# Patient Record
Sex: Male | Born: 1980 | Hispanic: Yes | Marital: Single | Smoking: Never smoker
Health system: Southern US, Community
[De-identification: ages and names within clinical notes are randomized; demographics above are authoritative.]

## PROBLEM LIST (undated history)

## (undated) HISTORY — PX: HERNIA REPAIR: SHX51

---

## 2013-09-13 ENCOUNTER — Emergency Department (HOSPITAL_COMMUNITY): Payer: Worker's Compensation

## 2013-09-13 ENCOUNTER — Emergency Department (HOSPITAL_COMMUNITY)
Admission: EM | Admit: 2013-09-13 | Discharge: 2013-09-13 | Disposition: A | Discharge status: Home / Self Care | Payer: Worker's Compensation | Attending: Emergency Medicine | Admitting: Emergency Medicine

## 2013-09-13 ENCOUNTER — Encounter (HOSPITAL_COMMUNITY): Payer: Self-pay | Admitting: Emergency Medicine

## 2013-09-13 DIAGNOSIS — Y929 Unspecified place or not applicable: Secondary | ICD-10-CM | POA: Insufficient documentation

## 2013-09-13 DIAGNOSIS — W11XXXA Fall on and from ladder, initial encounter: Secondary | ICD-10-CM | POA: Insufficient documentation

## 2013-09-13 DIAGNOSIS — S46909A Unspecified injury of unspecified muscle, fascia and tendon at shoulder and upper arm level, unspecified arm, initial encounter: Secondary | ICD-10-CM | POA: Diagnosis present

## 2013-09-13 DIAGNOSIS — S4980XA Other specified injuries of shoulder and upper arm, unspecified arm, initial encounter: Secondary | ICD-10-CM | POA: Diagnosis present

## 2013-09-13 DIAGNOSIS — Y939 Activity, unspecified: Secondary | ICD-10-CM | POA: Insufficient documentation

## 2013-09-13 DIAGNOSIS — S4992XA Unspecified injury of left shoulder and upper arm, initial encounter: Secondary | ICD-10-CM

## 2013-09-13 MED ORDER — HYDROCODONE-ACETAMINOPHEN 5-325 MG PO TABS: 1.0000 | ORAL_TABLET | ORAL | Status: DC | PRN

## 2013-09-13 MED ORDER — HYDROCODONE-ACETAMINOPHEN 5-325 MG PO TABS
2.0000 | ORAL_TABLET | Freq: Once | ORAL | Status: AC
  Administered 2013-09-13: 2 via ORAL
  Filled 2013-09-13: qty 2

## 2013-09-13 NOTE — ED Notes (Signed)
Patient was hurt at work a few weeks ago when a ladder fell on his left shoulder.  Patient continues to have pain.  Work sent him in to be evaluated.  Patient is CAOx3.  CSMT's and pulses intact.

## 2013-09-13 NOTE — Discharge Instructions (Signed)
Take Vicodin as needed for pain. Follow up with Dr. Roda ShuttersXu for further evaluation. Wear arm sling until further evaluation.

## 2013-09-13 NOTE — ED Provider Notes (Signed)
CSN: [id number]     Arrival date & time 09/13/13  1914 History  This chart was scribed for non-physician practitioner, Emilia Beck, PA-C working with Gavin Pound. Oletta Lamas, MD by Greggory Stallion, ED scribe. This patient was seen in room TR05C/TR05C and the patient's care was started at 8:51 PM.   Chief Complaint  Patient presents with  . Shoulder Pain   The history is provided by the patient. A language interpreter was used (pt's girlfriend).   HPI Comments: Rylon Poitra is a 33 y.o. male who presents to the Emergency Department complaining of left shoulder injury that occurred a few weeks ago. Pt states he fell off of a ladder and landed on his right shoulder. He has sudden onset, constant right shoulder pain. Denies other injury or pain.   History reviewed. No pertinent past medical history. Past Surgical History  Procedure Laterality Date  . Hernia repair     History reviewed. No pertinent family history. History  Substance Use Topics  . Smoking status: Never Smoker   . Smokeless tobacco: Not on file  . Alcohol Use: Yes     Comment: seldom    Review of Systems  Musculoskeletal: Positive for arthralgias.  All other systems reviewed and are negative.   Allergies  Review of patient's allergies indicates no known allergies.  Home Medications   Current Outpatient Rx  Name  Route  Sig  Dispense  Refill  . acetaminophen (TYLENOL) 500 MG tablet   Oral   Take 1,000 mg by mouth every 6 (six) hours as needed for mild pain or moderate pain.         Marland Kitchen ibuprofen (ADVIL,MOTRIN) 400 MG tablet   Oral   Take 400 mg by mouth every 6 (six) hours as needed for headache or mild pain.          BP 136/89  Pulse 86  Temp(Src) 98 F (36.7 C) (Oral)  Resp 18  SpO2 100%  Physical Exam  Nursing note and vitals reviewed. Constitutional: He is oriented to person, place, and time. He appears well-developed and well-nourished. No distress.  HENT:  Head: Normocephalic and atraumatic.   Eyes: EOM are normal.  Neck: Neck supple. No tracheal deviation present.  Cardiovascular: Normal rate.   Pulmonary/Chest: Effort normal. No respiratory distress.  Musculoskeletal: Normal range of motion.  Left AC joint tenderness to palpation. Left shoulder limited ROM due to pain. No obvious deformity.   Neurological: He is alert and oriented to person, place, and time.  Skin: Skin is warm and dry.  Psychiatric: He has a normal mood and affect. His behavior is normal.    ED Course  Procedures (including critical care time)  DIAGNOSTIC STUDIES: Oxygen Saturation is 100% on RA, normal by my interpretation.    COORDINATION OF CARE: 8:53 PM-Discussed treatment plan which includes sling, pain medication and orthopedic referral with pt at bedside and pt agreed to plan.   Labs Review Labs Reviewed - No data to display Imaging Review Dg Shoulder Left  09/13/2013   CLINICAL DATA:  Left shoulder injury and pain.  EXAM: LEFT SHOULDER - 2+ VIEW  COMPARISON:  None.  FINDINGS: No acute fracture or dislocation is identified. There may be minimal elevation of the distal clavicle relative to the acromion, consistent with AC joint sprain. No soft tissue abnormalities are identified.  IMPRESSION: No acute fracture.  Suggestion of AC joint sprain.   Electronically Signed   By: Irish Lack M.D.   On: 09/13/2013 20:15  EKG Interpretation   None       MDM   1. Injury of left shoulder     8:59 PM Patient will have sling for left arm. Patient advised to follow up with Orthopedics for further evaluation. No other injury. Patient will have Vicodin as needed for pain. Vitals stable and patient afebrile.   I personally performed the services described in this documentation, which was scribed in my presence. The recorded information has been reviewed and is accurate.   Emilia BeckKaitlyn Joanmarie Tsang, PA-C 09/13/13 2100

## 2013-09-14 NOTE — ED Provider Notes (Signed)
Medical screening examination/treatment/procedure(s) were performed by non-physician practitioner and as supervising physician I was immediately available for consultation/collaboration.  EKG Interpretation   None         Antanasia Kaczynski Y. Lorraine Cimmino, MD 09/14/13 2056 

## 2013-09-21 ENCOUNTER — Emergency Department (HOSPITAL_COMMUNITY)
Admission: EM | Admit: 2013-09-21 | Discharge: 2013-09-21 | Disposition: A | Discharge status: Home / Self Care | Payer: Self-pay | Attending: Emergency Medicine | Admitting: Emergency Medicine

## 2013-09-21 ENCOUNTER — Encounter (HOSPITAL_COMMUNITY): Payer: Self-pay | Admitting: Emergency Medicine

## 2013-09-21 DIAGNOSIS — Z9889 Other specified postprocedural states: Secondary | ICD-10-CM | POA: Insufficient documentation

## 2013-09-21 DIAGNOSIS — K4090 Unilateral inguinal hernia, without obstruction or gangrene, not specified as recurrent: Secondary | ICD-10-CM | POA: Insufficient documentation

## 2013-09-21 NOTE — ED Notes (Signed)
Swelling present on the left groin area. Pt is tender to touch. Pain is currently 3/10 while laying in the bed but 9/10 with movement.

## 2013-09-21 NOTE — Discharge Instructions (Signed)
Hernia inguinal - Adultos  (Inguinal Hernia, Adult)  Los msculos mantienen todos los rganos del cuerpo en Financial controllerel lugar correcto. Pero si se produce un punto dbil Valero Energyentre los msculos, algunos pueden protruir. Eso se llama hernia. Cuando esto sucede en la parte inferior del vientre (abdomen), se trata de una hernia inguinal. (Toma su nombre de una parte del cuerpo que en esta regin se llamada canal inguinal). Un punto dbil en la pared de los msculos deja que un poco de grasa o parte del intestino delgado salgan hacia afuera. Una hernia inguinal puede desarrollarse a cualquier edad. Los hombres la sufren con ms frecuencia que las mujeres.  CAUSAS  En los adultos, la hernia inguinal desarrolla con el tiempo.   Las causas pueden ser:  Un esfuerzo sbito de los msculos de la parte inferior del abdomen.  Levantar objetos pesados.  Dificultad para mover el intestino. La dificultad para mover el intestino (constipacin) puede llevar a una hernia.  Tos constante. La causa puede ser el tabaquismo o una enfermedad pulmonar.  Tener sobrepeso.  El [place]barazo.  Tener un empleo que requiera Location managerpermanecer largos perodos de pie o levantar objetos pesados.  Haber sufrido de una hernia inguinal anteriormente. En algunos casos puede convertirse en una situacin de Associate Professoremergencia. Cuando esto ocurre, se llama hernia inguinal estrangulada. Se produce cuando una parte del intestino delgado se desliza a travs del punto dbil y no puede volver al abdomen. El flujo de [place] puede interrumpirse. Si esto ocurre, una parte del intestino puede morir. Esta situacin requiere [place] ciruga de [place]urgencia.  SNTOMAS  Generalmente una hernia inguinal pequea no tiene sntomas. Se diagnostica cuando un profesional de la salud hace un examen fsico. Las hernias ms grandes generalmente presentan sntomas.   En los adultos, los sntomas incluyen:  Un bulto en la ingle. Es fcil de Engineer, manufacturingdetectar cuando la persona est de pie. Puede  desaparecer al Javier Glazierestar acostado.  Los hombres pueden tener un bulto Proofreaderen el escroto.  Dolor o ardor en la ingle. Esto ocurre especialmente al levantar objetos, realizar un esfuerzo o toser.  Dolor sordo o sensacin de presin en la ingle.  Los signos de una hernia estrangulada pueden ser:  Neomia DearUna protuberancia en la ingle que duele mucho y est sensible al tacto.  Un bulto que se vuelve de color rojo o prpura.  Grant RutsFiebre, nuseas y vmitos.  Imposibilidad de evacuar el intestino o de eliminar gases. DIAGNSTICO  Para diagnosticar una hernia inguinal, el profesional le har un examen fsico.   Incluir preguntas acerca de los sntomas que haya notado.  El mdico palpar el rea de la ingle y le pedir que tosa. Si palpa una hernia inguinal, el mdico podr tratar de deslizarla de nuevo hacia adentro el abdomen.  Por lo general no se necesitan otros estudios. TRATAMIENTO  Los tratamientos IT consultantpueden variar. Dependern del tamao de la hernia. Las opciones incluyen:   Observacin cuidadosa. Esto a menudo se sugiere si la hernia es pequea y usted no ha tenido sntomas.  No se realizar ningn procedimiento mdico excepto que aparezcan sntomas.  Tendr que prestar atencin a los sntomas. Si tiene sntomas, comunquese con su mdico de inmediato.  Ciruga. Se realiza si la hernia es grande o si tiene sntomas.  Ciruga abierta. Por lo general, este es un procedimiento ambulatorio (no tendr que pasar la noche en el hospital). Se realiza un corte (incisin) a travs de la piel de la ingle. La hernia se vuelve a colocar en el interior del abdomen.  Luego se repara la zona dbil en los msculos con una herniorrafia o hernioplastia. Herniorrafa: en este tipo de [place], se suturan juntos los msculos dbiles. Hernioplasta: se coloca un parche o malla para cerrar el rea dbil en la pared abdominal.  Laparoscopia. En este procedimiento, el cirujano hace incisiones pequeas. Se coloca en el abdomen  un tubo delgado con una pequea cmara de video (llamado laparoscopio). El cirujano repara la hernia con [place], observando en una cmara de vdeo y [address] instrumentos largos. INSTRUCCIONES PARA EL CUIDADO EN EL HOGAR   Despus de la ciruga de reparacin de una hernia inguinal:  Necesitar tomar un analgsico para el dolor recetado por su mdico. Siga cuidadosamente todas las indicaciones.  Tendr que cuidar la herida de la incisin.  Deber restringir algunas actividades por un tiempo. Incluir no levantar objetos pesados   durante varias semanas. Tampoco podr hacer nada demasiado activo durante algunas semanas. La vuelta al Aleen Campi depender del tipo de trabajo que tenga.  Durante perodos de "espera vigilante", usted debe:  Mantenga un peso saludable.  Consumir una dieta rica en fibra (frutas, verduras y granos enteros).  Beba gran cantidad de lquidos para evitar la constipacin. Esto significa beber suficiente agua y otros lquidos para mantener la orina clara o de color amarillo plido.  No levante objetos pesados.  No permanezca de pie durante largos perodos.  Deje de fumar. Evite toser con frecuencia. SOLICITE ATENCIN MDICA SI:   Aparece una protuberancia en el rea de la ingle.  Siente dolor, tiene sensacin de [place] o de presin en la ingle. Esto podra empeorar si levanta pesos o hace esfuerzos.  Tiene fiebre de ms de 100.5 F (38.1 C). SOLICITE ATENCIN MDICA DE INMEDIATO SI:   El dolor en la ingle aumenta repentinamente.  Una protuberancia en la ingle se hace ms grande y no baja.  En los hombres, un dolor repentino en el escroto. O el escroto aumenta de tamao.  Un bulto en el rea de la ingle se vuelve de color rojo o prpura y es dolorosa al tacto.  Tiene nuseas o vmitos que no desaparecen.  Siente que su corazn late mucho ms rpido de lo normal.  No puede mover el intestino o eliminar gases.  Tiene fiebre de ms de 102.0 F  (38.9 C). Document Released: 11/30/2012 [facility] Patient Information 2014 [place], [place].

## 2013-09-21 NOTE — ED Notes (Addendum)
Pt reports left inguinal hernia x 1 year. States pain has been worse over past few days but it can be reduced. Pt speaks spanish, has significant other translating. Denies any issues with bowel movement or urination. Pt is ambulatory, a x 4. In NAD

## 2013-09-21 NOTE — ED Provider Notes (Signed)
CSN: [id number]     Arrival date & time 09/21/13  1236 History   First MD Initiated Contact with Patient 09/21/13 1248     Chief Complaint  Patient presents with  . Inguinal Hernia   HPI Patient presents with your long history of left inguinal hernia which is easily reducible.  The hernia returns with any exertion or walking.  It is not associated with nausea, vomiting, or bowel distention.  Patient has had no fever or chills.  Has previous history of hernia on the right side which was repaired. History reviewed. No pertinent past medical history. Past Surgical History  Procedure Laterality Date  . Hernia repair     No family history on file. History  Substance Use Topics  . Smoking status: Never Smoker   . Smokeless tobacco: Not on file  . Alcohol Use: Yes     Comment: seldom    Review of Systems  Unable to perform ROS All other systems reviewed and are negative.    Allergies  Review of patient's allergies indicates no known allergies.  Home Medications   Current Outpatient Rx  Name  Route  Sig  Dispense  Refill  . acetaminophen (TYLENOL) 500 MG tablet   Oral   Take 1,000 mg by mouth every 6 (six) hours as needed for mild pain or moderate pain.         Marland Kitchen. HYDROcodone-acetaminophen (NORCO/VICODIN) 5-325 MG per tablet   Oral   Take 1-2 tablets by mouth every 4 (four) hours as needed.   14 tablet   0   . ibuprofen (ADVIL,MOTRIN) 400 MG tablet   Oral   Take 400 mg by mouth every 6 (six) hours as needed for headache or mild pain.          BP 139/93  Pulse 89  Temp(Src) 97.7 F (36.5 C) (Oral)  Resp 20  Ht 5\' 10"  (1.778 m)  Wt 180 lb (81.647 kg)  BMI 25.83 kg/m2  SpO2 98% Physical Exam  Nursing note and vitals reviewed. Constitutional: He is oriented to person, place, and time. He appears well-developed and well-nourished. No distress.  HENT:  Head: Normocephalic and atraumatic.  Eyes: Pupils are equal, round, and reactive to light.  Neck: Normal range of  motion.  Cardiovascular: Normal rate and intact distal pulses.   Pulmonary/Chest: No respiratory distress.  Abdominal: Soft. Normal appearance and bowel sounds are normal. He exhibits no distension. A hernia is present. Hernia confirmed positive in the left inguinal area (Easily reducible).  Musculoskeletal: Normal range of motion.  Neurological: He is alert and oriented to person, place, and time. No cranial nerve deficit.  Skin: Skin is warm and dry. No rash noted.  Psychiatric: He has a normal mood and affect. His behavior is normal.    ED Course  Procedures (including critical care time) Labs Review Labs Reviewed - No data to display Imaging Review No results found.  I discussed warning signs were incarcerated hernia with patient through the interpreter.  Patient was instructed to call for appointment with general surgery as soon as possible.  Patient instructed to return to emergency department if any signs of incarceration such as unremitting pain, abdominal distention, vomiting, nausea, fever.  MDM   1. Reducible inguinal hernia        Nelia Shiobert L Donnajean Chesnut, MD 09/21/13 [phone]

## 2019-12-11 ENCOUNTER — Emergency Department (HOSPITAL_COMMUNITY)
Admission: EM | Admit: 2019-12-11 | Discharge: 2019-12-11 | Disposition: A | Discharge status: Home / Self Care | Payer: Self-pay | Attending: Emergency Medicine | Admitting: Emergency Medicine

## 2019-12-11 ENCOUNTER — Other Ambulatory Visit: Payer: Self-pay

## 2019-12-11 DIAGNOSIS — Y929 Unspecified place or not applicable: Secondary | ICD-10-CM | POA: Insufficient documentation

## 2019-12-11 DIAGNOSIS — Y939 Activity, unspecified: Secondary | ICD-10-CM | POA: Insufficient documentation

## 2019-12-11 DIAGNOSIS — Z23 Encounter for immunization: Secondary | ICD-10-CM | POA: Insufficient documentation

## 2019-12-11 DIAGNOSIS — S0101XA Laceration without foreign body of scalp, initial encounter: Secondary | ICD-10-CM | POA: Insufficient documentation

## 2019-12-11 DIAGNOSIS — Y999 Unspecified external cause status: Secondary | ICD-10-CM | POA: Insufficient documentation

## 2019-12-11 MED ORDER — CEPHALEXIN 500 MG PO CAPS: 500.0000 mg | ORAL_CAPSULE | Freq: Two times a day (BID) | ORAL | 0 refills | Status: AC

## 2019-12-11 MED ORDER — TETANUS-DIPHTH-ACELL PERTUSSIS 5-2.5-18.5 LF-MCG/0.5 IM SUSP
0.5000 mL | Freq: Once | INTRAMUSCULAR | Status: AC
  Administered 2019-12-11: 09:00:00 0.5 mL via INTRAMUSCULAR
  Filled 2019-12-11: qty 0.5

## 2019-12-11 MED ORDER — CEPHALEXIN 250 MG PO CAPS
500.0000 mg | ORAL_CAPSULE | Freq: Once | ORAL | Status: AC
  Administered 2019-12-11: 09:00:00 500 mg via ORAL
  Filled 2019-12-11: qty 2

## 2019-12-11 MED ORDER — OXYCODONE-ACETAMINOPHEN 5-325 MG PO TABS
1.0000 | ORAL_TABLET | Freq: Once | ORAL | Status: AC
  Administered 2019-12-11: 09:00:00 1 via ORAL
  Filled 2019-12-11: qty 1

## 2019-12-11 NOTE — Discharge Instructions (Addendum)
Staple repair Keep the laceration site dry for the next 24 hours and leave the dressing in place. After 24 hours you may remove the dressing and gently clean the laceration site with antibacterial soap and warm water. Do not scrub the area. Do not soak the area and water for long periods of time. Don't use hydrogen peroxide, iodine-based solutions, or alcohol, which can slow healing, and will probably be painful! Apply topical bacitracin 1-2 times per day for the next 3-5 days. Return to the emergency department in 7-10 days for removal of the staples.  You should return sooner for any signs of infection which would include increased redness around the wound, increased swelling, new drainage of yellow pus.   Take antibiotics as prescribed. Please use Tylenol or ibuprofen for pain.  You may use 600 mg ibuprofen every 6 hours or 1000 mg of Tylenol every 6 hours.  You may choose to alternate between the 2.  This would be most effective.  Not to exceed 4 g of Tylenol within 24 hours.  Not to exceed 3200 mg ibuprofen 24 hours.
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
## 2020-05-27 ENCOUNTER — Emergency Department (HOSPITAL_COMMUNITY): Payer: Self-pay

## 2020-05-27 ENCOUNTER — Emergency Department (HOSPITAL_COMMUNITY)
Admission: EM | Admit: 2020-05-27 | Discharge: 2020-05-27 | Disposition: A | Discharge status: Home / Self Care | Payer: Self-pay | Attending: Emergency Medicine | Admitting: Emergency Medicine

## 2020-05-27 DIAGNOSIS — K4090 Unilateral inguinal hernia, without obstruction or gangrene, not specified as recurrent: Secondary | ICD-10-CM | POA: Insufficient documentation

## 2020-05-27 LAB — COMPREHENSIVE METABOLIC PANEL
ALT: 27 U/L (ref 0–44)
AST: 20 U/L (ref 15–41)
Albumin: 3.8 g/dL (ref 3.5–5.0)
Alkaline Phosphatase: 77 U/L (ref 38–126)
Anion gap: 10 (ref 5–15)
BUN: 13 mg/dL (ref 6–20)
CO2: 24 mmol/L (ref 22–32)
Calcium: 9.3 mg/dL (ref 8.9–10.3)
Chloride: 105 mmol/L (ref 98–111)
Creatinine, Ser: 0.89 mg/dL (ref 0.61–1.24)
GFR, Estimated: >60 mL/min (ref >60)
Glucose, Bld: 96 mg/dL (ref 70–99)
Potassium: 3.4 mmol/L — ABNORMAL LOW (ref 3.5–5.1)
Sodium: 139 mmol/L (ref 135–145)
Total Bilirubin: 0.5 mg/dL (ref 0.3–1.2)
Total Protein: 7 g/dL (ref 6.5–8.1)

## 2020-05-27 LAB — CBC
HCT: 45.6 % (ref 39.0–52.0)
Hemoglobin: 15 g/dL (ref 13.0–17.0)
MCH: 32.2 pg (ref 26.0–34.0)
MCHC: 32.9 g/dL (ref 30.0–36.0)
MCV: 97.9 fL (ref 80.0–100.0)
Platelets: 252 10*3/uL (ref 150–400)
RBC: 4.66 MIL/uL (ref 4.22–5.81)
RDW: 12.1 % (ref 11.5–15.5)
WBC: 7 10*3/uL (ref 4.0–10.5)
nRBC: 0 % (ref 0.0–0.2)

## 2020-05-27 LAB — URINALYSIS, ROUTINE W REFLEX MICROSCOPIC
Bilirubin Urine: NEGATIVE
Glucose, UA: NEGATIVE mg/dL
Hgb urine dipstick: NEGATIVE
Ketones, ur: NEGATIVE mg/dL
Leukocytes,Ua: NEGATIVE
Nitrite: NEGATIVE
Protein, ur: NEGATIVE mg/dL
Specific Gravity, Urine: 1.005 (ref 1.005–1.030)
pH: 6 (ref 5.0–8.0)

## 2020-05-27 LAB — LIPASE, BLOOD: Lipase: 31 U/L (ref 11–51)

## 2020-05-27 MED ORDER — FENTANYL CITRATE (PF) 100 MCG/2ML IJ SOLN
50.0000 ug | Freq: Once | INTRAMUSCULAR | Status: AC
  Administered 2020-05-27: 50 ug via INTRAVENOUS
  Filled 2020-05-27: qty 2

## 2020-05-27 MED ORDER — HYDROCODONE-ACETAMINOPHEN 5-325 MG PO TABS: 1.0000 | ORAL_TABLET | Freq: Four times a day (QID) | ORAL | 0 refills | Status: DC | PRN

## 2020-05-27 MED ORDER — IOHEXOL 300 MG/ML  SOLN
100.0000 mL | Freq: Once | INTRAMUSCULAR | Status: AC | PRN
  Administered 2020-05-27: 100 mL via INTRAVENOUS

## 2020-05-27 MED ORDER — ONDANSETRON HCL 4 MG/2ML IJ SOLN
4.0000 mg | Freq: Once | INTRAMUSCULAR | Status: AC
  Administered 2020-05-27: 4 mg via INTRAVENOUS
  Filled 2020-05-27: qty 2

## 2020-05-27 NOTE — ED Provider Notes (Signed)
[facility] EMERGENCY DEPARTMENT Provider Note   CSN: [id number] Arrival date & time: 05/27/20  1231     History Chief Complaint  Patient presents with  . Abdominal Pain    Ryan Holt is a 39 y.o. male who presents for evaluation of left lower quadrant abdominal pain and left testicular pain x2 weeks.  He states that he has had some intermittent abdominal pain over the last 2 weeks it feels like over the last few days, it is gotten worse.  He states the pain starts in his left lower quadrant and radiates into his groin.  He has a history of a hernia and states that it has been out for several months but it started growing larger.  He has not seen anybody for evaluation of this.  He has not noted any fevers.  He does state that he has been able to urinate but that sometimes he has to strain to urinate.  He has not been taking any medications.  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  He has not had any nausea/vomiting, diarrhea, difficulty urinating.  The history is provided by the patient. A language interpreter was used.       No past medical history on file.  There are no problems to display for this patient.   Past Surgical History:  Procedure Laterality Date  . HERNIA REPAIR         No family history on file.  Social History   Tobacco Use  . Smoking status: Never Smoker  Substance Use Topics  . Alcohol use: Yes    Comment: seldom  . Drug use: No    Home Medications Prior to Admission medications   Medication Sig Start Date End Date Taking? Authorizing Provider  acetaminophen (TYLENOL) 500 MG tablet Take 1,000 mg by mouth every 6 (six) hours as needed for mild pain or moderate pain.    [provider]  HYDROcodone-acetaminophen (NORCO/VICODIN) 5-325 MG tablet Take 1-2 tablets by mouth every 6 (six) hours as needed. 05/27/20   Maxwell Caul, PA-C  ibuprofen  (ADVIL,MOTRIN) 400 MG tablet Take 400 mg by mouth every 6 (six) hours as needed for headache or mild pain.    [provider]    Allergies    Patient has no known allergies.  Review of Systems   Review of Systems  Constitutional: Negative for fever.  Respiratory: Negative for cough and shortness of breath.   Cardiovascular: Negative for chest pain.  Gastrointestinal: Positive for abdominal pain. Negative for nausea and vomiting.  Genitourinary: Positive for scrotal swelling and testicular pain. Negative for difficulty urinating, dysuria and hematuria.  Neurological: Negative for headaches.  All other systems reviewed and are negative.   Physical Exam Updated Vital Signs BP 119/69   Pulse 71   Temp 98.5 F (36.9 C) (Oral)   Resp 16   Ht 5\' 6"  (1.676 m)   SpO2 98%   BMI 29.05 kg/m   Physical Exam Vitals and nursing note reviewed. Exam conducted with a chaperone present.  Constitutional:      Appearance: Normal appearance. He is well-developed.  HENT:     Head: Normocephalic and atraumatic.  Eyes:     General: Lids are normal.     Conjunctiva/sclera: Conjunctivae normal.     Pupils: Pupils are equal, round, and reactive to light.  Cardiovascular:     Rate and Rhythm:  Normal rate and regular rhythm.     Pulses: Normal pulses.     Heart sounds: Normal heart sounds. No murmur heard.  No friction rub. No gallop.   Pulmonary:     Effort: Pulmonary effort is normal.     Breath sounds: Normal breath sounds.     Comments: Lungs clear to auscultation bilaterally.  Symmetric chest rise.  No wheezing, rales, rhonchi. Abdominal:     Palpations: Abdomen is soft. Abdomen is not rigid.     Tenderness: There is no abdominal tenderness. There is left CVA tenderness. There is no guarding.     Hernia: A hernia is present.     Comments: Abdomen soft, nondistended.  Tenderness palpation in the left lower quadrant.  Genitourinary:    Comments: The exam was performed with a  chaperone present Loree Fee).  Large inguinal hernia noted that extends into the left testicle.  No overlying warmth, erythema.  No tenderness palpation of the right testicle. Musculoskeletal:        General: Normal range of motion.     Cervical back: Full passive range of motion without pain.  Skin:    General: Skin is warm and dry.     Capillary Refill: Capillary refill takes less than 2 seconds.  Neurological:     Mental Status: He is alert and oriented to person, place, and time.  Psychiatric:        Speech: Speech normal.     ED Results / Procedures / Treatments   Labs (all labs ordered are listed, but only abnormal results are displayed) Labs Reviewed  COMPREHENSIVE METABOLIC PANEL - Abnormal; Notable for the following components:      Result Value   Potassium 3.4 (*)    All other components within normal limits  URINALYSIS, ROUTINE W REFLEX MICROSCOPIC - Abnormal; Notable for the following components:   Color, Urine STRAW (*)    All other components within normal limits  LIPASE, BLOOD  CBC    EKG None  Radiology CT ABDOMEN PELVIS W CONTRAST  Result Date: 05/27/2020 CLINICAL DATA:  Abdominal pain radiating to left groin. Suspected hernia. EXAM: CT ABDOMEN AND PELVIS WITH CONTRAST TECHNIQUE: Multidetector CT imaging of the abdomen and pelvis was performed using the standard protocol following bolus administration of intravenous contrast. CONTRAST:  OMNIPAQUE IOHEXOL 300 MG/ML  SOLN COMPARISON:  None. FINDINGS: Lower Chest: No acute findings. Hepatobiliary: No hepatic masses identified. Gallbladder is unremarkable. No evidence of biliary ductal dilatation. Pancreas:  No mass or inflammatory changes. Spleen: Within normal limits in size and appearance. Adrenals/Urinary Tract: No masses identified. No evidence of ureteral calculi or hydronephrosis. Stomach/Bowel: No evidence of obstruction, inflammatory process or abnormal fluid collections. Normal appendix visualized.  Vascular/Lymphatic: No pathologically enlarged lymph nodes. No abdominal aortic aneurysm. Reproductive:  No mass or other significant abnormality. Other: A large left inguinal hernia is seen which contains only fat. Musculoskeletal:  No suspicious bone lesions identified. IMPRESSION: Large left inguinal hernia containing only fat. Electronically Signed   By: Danae Orleans M.D.   On: 05/27/2020 18:55    Procedures Procedures (including critical care time)  Medications Ordered in ED Medications  fentaNYL (SUBLIMAZE) injection 50 mcg (50 mcg Intravenous Given 05/27/20 1744)  ondansetron (ZOFRAN) injection 4 mg (4 mg Intravenous Given 05/27/20 1744)  iohexol (OMNIPAQUE) 300 MG/ML solution 100 mL (100 mLs Intravenous Contrast Given 05/27/20 1838)  fentaNYL (SUBLIMAZE) injection 50 mcg (50 mcg Intravenous Given 05/27/20 1850)    ED Course  I  have reviewed the triage vital signs and the nursing notes.  Pertinent labs & imaging results that were available during my care of the patient were reviewed by me and considered in my medical decision making (see chart for details).    MDM Rules/Calculators/A&P                          39 year old male who presents for evaluation of abdominal pain has been ongoing for the last several weeks.  Worsened over the last few days.  History of hernia has never had it repaired.  Initially arrival, he is afebrile nontoxic-appearing.  Vital signs are stable.  On exam, he has tenderness palpation of the left lower quadrant as well as left testicle with obvious hernia noted.  Will obtain blood work, give analgesics, obtain a CT scan.  CBC shows no leukocytosis or anemia.  UA shows no evidence of infection.  Lipase is unremarkable.  CMP shows potassium of 3.4.  CT scan shows large left inguinal hernia containing only fat.  No other acute abnormalities.  I discussed with patient via language interpreter.  At this time, his work-up is not concerning for infectious or  ischemic etiology.  His CT scan shows that the hernia only contains fat.  No signs of obstruction.  He has been able to urinate here in the ED.  We will plan for pain control, GEN surge follow-up. At this time, patient exhibits no emergent life-threatening condition that require further evaluation in ED. Discussed patient with Dr. Silverio Lay is who is agreeable. Patient had ample opportunity for questions and discussion. All patient's questions were answered with full understanding. Strict return precautions discussed. Patient expresses understanding and agreement to plan.   Portions of this note were generated with Scientist, clinical (histocompatibility and immunogenetics). Dictation errors may occur despite best attempts at proofreading.   Final Clinical Impression(s) / ED Diagnoses Final diagnoses:  Left inguinal hernia    Rx / DC Orders ED Discharge Orders         Ordered    HYDROcodone-acetaminophen (NORCO/VICODIN) 5-325 MG tablet  Every 6 hours PRN        05/27/20 1957           Maxwell Caul, PA-C 05/27/20 2011    Charlynne Pander, MD 05/27/20 2033

## 2020-05-27 NOTE — Discharge Instructions (Addendum)
You can take Tylenol or Ibuprofen as directed for pain. You can alternate Tylenol and Ibuprofen every 4 hours. If you take Tylenol at 1pm, then you can take Ibuprofen at 5pm. Then you can take Tylenol again at 9pm.   Take pain medications as directed for break through pain. Do not drive or operate machinery while taking this medication.   As we discussed, you can [facility] belt at [facility] to help support.  Your work-up today was reassuring.  Your CT scan showed that the hernia only contains fat with no signs of it containing your small intestine.  This is reassuring.  Because this contains fat, we will leave it out.  You need to follow-up with the referred general surgeon for further evaluation and management of this.  As we discussed, closely monitor your symptoms.  Return to the emergency department for any worsening pain, fevers, inability to urinate, vomiting or any other worsening concerning symptoms.

## 2020-05-27 NOTE — ED Triage Notes (Signed)
Pt presents for eval of abdominal pain with radiation to groin, worse with movment. Pt concerned he has a hernia. Pain present for "a while" but pain worse within the last week. Denies n/v/d.

## 2020-05-31 ENCOUNTER — Ambulatory Visit: Payer: Self-pay | Admitting: General Surgery

## 2020-05-31 NOTE — H&P (Signed)
History of Present Illness Ryan Filler MD; 05/31/2020 3:10 PM) The patient is a 39 year old male who presents with an inguinal hernia. Patient is a 39 year old male, Spanish-speaking comes in with a large left inguinal hernia. Patient states is been there for approximately 2 years. He states become more bothersome over the last couple months. 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  Patient states is able to massage the hernia and reduce it is lying down. He states it thereafter usually pops back out.  He's had only a previous open right inguinal hernia repair in the past as a child.   Diagnostic Studies History Ryan Holt, CMA; 05/31/2020 2:47 PM) Colonoscopy  never  Allergies (Ryan Holt, CMA; 05/31/2020 2:47 PM) No Known Drug Allergies  [05/31/2020]: Allergies Reconciled   Medication History (Ryan Holt, CMA; 05/31/2020 2:48 PM) HYDROcodone-Acetaminophen (5-325MG  Tablet, Oral) Active. Medications Reconciled  Social History Ryan Holt, CMA; 05/31/2020 2:47 PM) Caffeine use  Coffee. No drug use   Family History Ryan Holt, CMA; 05/31/2020 2:47 PM) Arthritis  Father. Diabetes Mellitus  Mother. Hypertension  Mother.    Review of Systems Ryan Filler MD; 05/31/2020 3:09 PM) Skin Not Present- Change in Wart/Mole, Dryness, Hives, Jaundice, New Lesions, Non-Healing Wounds, Rash and Ulcer. HEENT Not Present- Earache, Hearing Loss, Hoarseness, Nose Bleed, Oral Ulcers, Ringing in the Ears, Seasonal Allergies, Sinus Pain, Sore Throat, Visual Disturbances, Wears glasses/contact lenses and Yellow Eyes. Respiratory Not Present- Bloody sputum, Chronic Cough, Difficulty Breathing, Snoring and Wheezing. Breast Not Present- Breast Mass, Breast Pain, Nipple Discharge and Skin Changes. Gastrointestinal Not Present- Abdominal Pain, Bloating, Bloody Stool, Change in Bowel Habits, Chronic diarrhea, Constipation, Difficulty  Swallowing, Excessive gas, Gets full quickly at meals, Hemorrhoids, Indigestion, Nausea, Rectal Pain and Vomiting. Male Genitourinary Not Present- Blood in Urine, Change in Urinary Stream, Frequency, Impotence, Nocturia, Painful Urination, Urgency and Urine Leakage. Musculoskeletal Not Present- Back Pain, Joint Pain, Joint Stiffness, Muscle Pain, Muscle Weakness and Swelling of Extremities. Neurological Not Present- Decreased Memory, Fainting, Headaches, Numbness, Seizures, Tingling, Tremor, Trouble walking and Weakness. Psychiatric Not Present- Anxiety, Bipolar, Change in Sleep Pattern, Depression, Fearful and Frequent crying. All other systems negative  Vitals (Ryan Holt CMA; 05/31/2020 2:48 PM) 05/31/2020 2:48 PM Weight: 187.38 lb Height: 67in Body Surface Area: 1.97 m Body Mass Index: 29.35 kg/m  Temp.: 97.21F  Pulse: 83 (Regular)  BP: 132/74(Sitting, Left Arm, Standard)       Physical Exam Ryan Filler MD; 05/31/2020 3:10 PM) The physical exam findings are as follows: Note: Constitutional: No acute distress, conversant, appears stated age  Eyes: Anicteric sclerae, moist conjunctiva, no lid lag  Neck: No thyromegaly, trachea midline, no cervical lymphadenopathy  Lungs: Clear to auscultation biilaterally, normal respiratory effot  Cardiovascular: regular rate & rhythm, no murmurs, no peripheal edema, pedal pulses 2+  GI: Soft, no masses or hepatosplenomegaly, non-tender to palpation  MSK: Normal gait, no clubbing cyanosis, edema  Skin: No rashes, palpation reveals normal skin turgor  Psychiatric: Appropriate judgment and insight, oriented to person, place, and time  Abdomen Inspection Hernias - Inguinal hernia - Left - Incarcerated - Left.    Assessment & Plan Ryan Filler MD; 05/31/2020 3:11 PM) LEFT INGUINAL HERNIA (K40.90) Impression: 39 year old male with large left inguinal hernia  1. The patient will like to proceed to the operating  room for open right inguinal hernia repair with mesh.  2. I discussed with the patient the signs and symptoms of incarceration and strangulation and the  need to proceed to the ER should they occur.  3. I discussed with the patient the risks and benefits of the procedure to include but not limited to: Infection, bleeding, damage to surrounding structures, possible need for further surgery, possible nerve pain, and possible recurrence. The patient was understanding and wishes to proceed.

## 2020-07-31 ENCOUNTER — Other Ambulatory Visit (HOSPITAL_COMMUNITY)
Admission: RE | Admit: 2020-07-31 | Discharge: 2020-07-31 | Disposition: A | Discharge status: Home / Self Care | Payer: HRSA Program | Source: Ambulatory Visit | Attending: General Surgery | Admitting: General Surgery

## 2020-07-31 DIAGNOSIS — Z01812 Encounter for preprocedural laboratory examination: Secondary | ICD-10-CM | POA: Insufficient documentation

## 2020-07-31 DIAGNOSIS — Z20822 Contact with and (suspected) exposure to covid-19: Secondary | ICD-10-CM | POA: Insufficient documentation

## 2020-07-31 LAB — SARS CORONAVIRUS 2 (TAT 6-24 HRS): SARS Coronavirus 2: NEGATIVE

## 2020-08-02 ENCOUNTER — Encounter (HOSPITAL_COMMUNITY): Payer: Self-pay | Admitting: General Surgery

## 2020-08-02 NOTE — Progress Notes (Signed)
PCP:  Denies Cardiologist:  Denies  EKG:  N/A CXR:  N/A ECHO:  Denies Stress Test:  Denies Cardiac Cath:  Denies  Fasting Blood Sugar-  N/A Checks Blood Sugar__N/A_ times a day  OSA/CPAP:  No  ASA/Blood Thinners:  No  Covid test negative  Anesthesia Review:  No  Patient denies shortness of breath, fever, cough, and chest pain at PAT appointment.  Patient verbalized understanding of instructions provided today at the PAT appointment.  Patient asked to review instructions at home and day of surgery.

## 2020-08-03 ENCOUNTER — Ambulatory Visit (HOSPITAL_COMMUNITY)
Admission: RE | Admit: 2020-08-03 | Discharge: 2020-08-03 | Disposition: A | Discharge status: Home / Self Care | Payer: Self-pay | Attending: General Surgery | Admitting: General Surgery

## 2020-08-03 ENCOUNTER — Ambulatory Visit (HOSPITAL_COMMUNITY): Payer: Self-pay | Admitting: Anesthesiology

## 2020-08-03 ENCOUNTER — Encounter (HOSPITAL_COMMUNITY)
Admission: RE | Disposition: A | Discharge status: Home / Self Care | Payer: Self-pay | Source: Home / Self Care | Attending: General Surgery

## 2020-08-03 ENCOUNTER — Encounter (HOSPITAL_COMMUNITY): Payer: Self-pay | Admitting: General Surgery

## 2020-08-03 DIAGNOSIS — Z79899 Other long term (current) drug therapy: Secondary | ICD-10-CM | POA: Insufficient documentation

## 2020-08-03 DIAGNOSIS — Z8249 Family history of ischemic heart disease and other diseases of the circulatory system: Secondary | ICD-10-CM | POA: Insufficient documentation

## 2020-08-03 DIAGNOSIS — Z833 Family history of diabetes mellitus: Secondary | ICD-10-CM | POA: Insufficient documentation

## 2020-08-03 DIAGNOSIS — Z8261 Family history of arthritis: Secondary | ICD-10-CM | POA: Insufficient documentation

## 2020-08-03 DIAGNOSIS — K4030 Unilateral inguinal hernia, with obstruction, without gangrene, not specified as recurrent: Secondary | ICD-10-CM | POA: Insufficient documentation

## 2020-08-03 HISTORY — PX: INGUINAL HERNIA REPAIR: SHX194

## 2020-08-03 HISTORY — PX: INSERTION OF MESH: SHX5868

## 2020-08-03 SURGERY — REPAIR, HERNIA, INGUINAL, ADULT
Anesthesia: Regional | Site: Inguinal | Laterality: Left

## 2020-08-03 MED ORDER — CEFAZOLIN SODIUM-DEXTROSE 2-4 GM/100ML-% IV SOLN
2.0000 g | INTRAVENOUS | Status: AC
  Administered 2020-08-03: 09:00:00 2 g via INTRAVENOUS
  Filled 2020-08-03: qty 100

## 2020-08-03 MED ORDER — FENTANYL CITRATE (PF) 250 MCG/5ML IJ SOLN
INTRAMUSCULAR | Status: DC | PRN
  Administered 2020-08-03 (×3): 50 ug via INTRAVENOUS
  Administered 2020-08-03: 100 ug via INTRAVENOUS

## 2020-08-03 MED ORDER — FENTANYL CITRATE (PF) 100 MCG/2ML IJ SOLN
INTRAMUSCULAR | Status: AC
  Filled 2020-08-03: qty 2

## 2020-08-03 MED ORDER — SUGAMMADEX SODIUM 200 MG/2ML IV SOLN
INTRAVENOUS | Status: DC | PRN
  Administered 2020-08-03: 200 mg via INTRAVENOUS

## 2020-08-03 MED ORDER — DEXAMETHASONE SODIUM PHOSPHATE 10 MG/ML IJ SOLN
INTRAMUSCULAR | Status: AC
  Filled 2020-08-03: qty 1

## 2020-08-03 MED ORDER — ACETAMINOPHEN 500 MG PO TABS
1000.0000 mg | ORAL_TABLET | ORAL | Status: AC
  Administered 2020-08-03: 08:00:00 1000 mg via ORAL
  Filled 2020-08-03: qty 2

## 2020-08-03 MED ORDER — BUPIVACAINE LIPOSOME 1.3 % IJ SUSP
INTRAMUSCULAR | Status: DC | PRN
  Administered 2020-08-03: 10 mL via PERINEURAL

## 2020-08-03 MED ORDER — PROMETHAZINE HCL 25 MG/ML IJ SOLN: 6.2500 mg | INTRAMUSCULAR | Status: DC | PRN

## 2020-08-03 MED ORDER — FENTANYL CITRATE (PF) 250 MCG/5ML IJ SOLN
INTRAMUSCULAR | Status: AC
  Filled 2020-08-03: qty 5

## 2020-08-03 MED ORDER — LIDOCAINE 2% (20 MG/ML) 5 ML SYRINGE
INTRAMUSCULAR | Status: DC | PRN
  Administered 2020-08-03: 80 mg via INTRAVENOUS

## 2020-08-03 MED ORDER — MIDAZOLAM HCL 2 MG/2ML IJ SOLN
INTRAMUSCULAR | Status: AC
  Administered 2020-08-03: 08:00:00 2 mg via INTRAVENOUS
  Filled 2020-08-03: qty 2

## 2020-08-03 MED ORDER — TRAMADOL HCL 50 MG PO TABS
50.0000 mg | ORAL_TABLET | Freq: Once | ORAL | Status: AC
  Administered 2020-08-03: 10:00:00 50 mg via ORAL

## 2020-08-03 MED ORDER — FENTANYL CITRATE (PF) 100 MCG/2ML IJ SOLN: 50.0000 ug | Freq: Once | INTRAMUSCULAR | Status: DC

## 2020-08-03 MED ORDER — PROPOFOL 10 MG/ML IV BOLUS
INTRAVENOUS | Status: DC | PRN
  Administered 2020-08-03: 180 mg via INTRAVENOUS

## 2020-08-03 MED ORDER — CHLORHEXIDINE GLUCONATE CLOTH 2 % EX PADS: 6.0000 | MEDICATED_PAD | Freq: Once | CUTANEOUS | Status: DC

## 2020-08-03 MED ORDER — CELECOXIB 200 MG PO CAPS
400.0000 mg | ORAL_CAPSULE | ORAL | Status: AC
  Administered 2020-08-03: 08:00:00 400 mg via ORAL
  Filled 2020-08-03: qty 2

## 2020-08-03 MED ORDER — BUPIVACAINE HCL (PF) 0.25 % IJ SOLN
INTRAMUSCULAR | Status: AC
  Filled 2020-08-03: qty 30

## 2020-08-03 MED ORDER — ROCURONIUM BROMIDE 10 MG/ML (PF) SYRINGE
PREFILLED_SYRINGE | INTRAVENOUS | Status: DC | PRN
  Administered 2020-08-03: 60 mg via INTRAVENOUS

## 2020-08-03 MED ORDER — CHLORHEXIDINE GLUCONATE 0.12 % MT SOLN
15.0000 mL | Freq: Once | OROMUCOSAL | Status: AC
  Administered 2020-08-03: 08:00:00 15 mL via OROMUCOSAL
  Filled 2020-08-03: qty 15

## 2020-08-03 MED ORDER — BUPIVACAINE HCL (PF) 0.25 % IJ SOLN: INTRAMUSCULAR | Status: DC | PRN

## 2020-08-03 MED ORDER — LACTATED RINGERS IV SOLN: INTRAVENOUS | Status: DC

## 2020-08-03 MED ORDER — LIDOCAINE 2% (20 MG/ML) 5 ML SYRINGE
INTRAMUSCULAR | Status: AC
  Filled 2020-08-03: qty 5

## 2020-08-03 MED ORDER — BUPIVACAINE HCL (PF) 0.5 % IJ SOLN
INTRAMUSCULAR | Status: DC | PRN
  Administered 2020-08-03: 15 mL via PERINEURAL

## 2020-08-03 MED ORDER — DEXAMETHASONE SODIUM PHOSPHATE 10 MG/ML IJ SOLN
INTRAMUSCULAR | Status: DC | PRN
  Administered 2020-08-03: 10 mg via INTRAVENOUS

## 2020-08-03 MED ORDER — TRAMADOL HCL 50 MG PO TABS: 50.0000 mg | ORAL_TABLET | Freq: Four times a day (QID) | ORAL | 0 refills | Status: AC | PRN

## 2020-08-03 MED ORDER — ONDANSETRON HCL 4 MG/2ML IJ SOLN
INTRAMUSCULAR | Status: AC
  Filled 2020-08-03: qty 2

## 2020-08-03 MED ORDER — ENSURE PRE-SURGERY PO LIQD: 296.0000 mL | Freq: Once | ORAL | Status: DC

## 2020-08-03 MED ORDER — TRAMADOL HCL 50 MG PO TABS
ORAL_TABLET | ORAL | Status: AC
  Filled 2020-08-03: qty 1

## 2020-08-03 MED ORDER — 0.9 % SODIUM CHLORIDE (POUR BTL) OPTIME
TOPICAL | Status: DC | PRN
  Administered 2020-08-03: 09:00:00 1000 mL

## 2020-08-03 MED ORDER — ROCURONIUM BROMIDE 10 MG/ML (PF) SYRINGE
PREFILLED_SYRINGE | INTRAVENOUS | Status: AC
  Filled 2020-08-03: qty 10

## 2020-08-03 MED ORDER — MIDAZOLAM HCL 2 MG/2ML IJ SOLN
INTRAMUSCULAR | Status: AC
  Filled 2020-08-03: qty 2

## 2020-08-03 MED ORDER — STERILE WATER FOR IRRIGATION IR SOLN
Status: DC | PRN
Start: 2020-08-03 — End: 2020-08-03
  Administered 2020-08-03: 100 mL via INTRAVESICAL

## 2020-08-03 MED ORDER — MIDAZOLAM HCL 2 MG/2ML IJ SOLN: 2.0000 mg | Freq: Once | INTRAMUSCULAR | Status: AC

## 2020-08-03 MED ORDER — FENTANYL CITRATE (PF) 100 MCG/2ML IJ SOLN
25.0000 ug | INTRAMUSCULAR | Status: DC | PRN
  Administered 2020-08-03 (×2): 50 ug via INTRAVENOUS

## 2020-08-03 MED ORDER — PROPOFOL 10 MG/ML IV BOLUS
INTRAVENOUS | Status: AC
  Filled 2020-08-03: qty 20

## 2020-08-03 MED ORDER — ORAL CARE MOUTH RINSE: 15.0000 mL | Freq: Once | OROMUCOSAL | Status: AC

## 2020-08-03 MED ORDER — GABAPENTIN 300 MG PO CAPS
300.0000 mg | ORAL_CAPSULE | ORAL | Status: AC
  Administered 2020-08-03: 08:00:00 300 mg via ORAL
  Filled 2020-08-03: qty 1

## 2020-08-03 MED ORDER — ONDANSETRON HCL 4 MG/2ML IJ SOLN
INTRAMUSCULAR | Status: DC | PRN
  Administered 2020-08-03: 4 mg via INTRAVENOUS

## 2020-08-03 SURGICAL SUPPLY — 40 items
CANISTER SUCT 3000ML PPV (MISCELLANEOUS) ×3 IMPLANT
CHLORAPREP W/TINT 26 (MISCELLANEOUS) ×3 IMPLANT
COVER SURGICAL LIGHT HANDLE (MISCELLANEOUS) ×3 IMPLANT
DERMABOND ADVANCED (GAUZE/BANDAGES/DRESSINGS) ×2
DERMABOND ADVANCED .7 DNX12 (GAUZE/BANDAGES/DRESSINGS) ×1 IMPLANT
DRAIN PENROSE 1/2X12 LTX STRL (WOUND CARE) ×3 IMPLANT
DRAPE LAPAROTOMY TRNSV 102X78 (DRAPES) ×3 IMPLANT
ELECT REM PT RETURN 9FT ADLT (ELECTROSURGICAL) ×3
ELECTRODE REM PT RTRN 9FT ADLT (ELECTROSURGICAL) ×1 IMPLANT
GAUZE 4X4 16PLY RFD (DISPOSABLE) ×3 IMPLANT
GLOVE BIO SURGEON STRL SZ7.5 (GLOVE) ×3 IMPLANT
GLOVE BIOGEL PI IND STRL 8 (GLOVE) ×1 IMPLANT
GLOVE BIOGEL PI INDICATOR 8 (GLOVE) ×2
GOWN STRL REUS W/ TWL LRG LVL3 (GOWN DISPOSABLE) ×2 IMPLANT
GOWN STRL REUS W/ TWL XL LVL3 (GOWN DISPOSABLE) ×1 IMPLANT
GOWN STRL REUS W/TWL LRG LVL3 (GOWN DISPOSABLE) ×4
GOWN STRL REUS W/TWL XL LVL3 (GOWN DISPOSABLE) ×2
KIT BASIN OR (CUSTOM PROCEDURE TRAY) ×3 IMPLANT
KIT TURNOVER KIT B (KITS) ×3 IMPLANT
MESH PARIETEX PROGRIP LEFT (Mesh General) ×3 IMPLANT
NEEDLE HYPO 25GX1X1/2 BEV (NEEDLE) ×3 IMPLANT
NS IRRIG 1000ML POUR BTL (IV SOLUTION) ×3 IMPLANT
PACK GENERAL/GYN (CUSTOM PROCEDURE TRAY) ×3 IMPLANT
PAD ARMBOARD 7.5X6 YLW CONV (MISCELLANEOUS) ×6 IMPLANT
PENCIL SMOKE EVACUATOR (MISCELLANEOUS) ×3 IMPLANT
SPONGE INTESTINAL PEANUT (DISPOSABLE) ×3 IMPLANT
SUT MNCRL AB 4-0 PS2 18 (SUTURE) ×3 IMPLANT
SUT PROLENE 2 0 SH DA (SUTURE) ×3 IMPLANT
SUT SILK 0 TIES 10X30 (SUTURE) ×3 IMPLANT
SUT VIC AB 2-0 SH 27 (SUTURE) ×2
SUT VIC AB 2-0 SH 27X BRD (SUTURE) ×1 IMPLANT
SUT VIC AB 3-0 SH 27 (SUTURE) ×2
SUT VIC AB 3-0 SH 27XBRD (SUTURE) ×1 IMPLANT
SUT VICRYL AB 2 0 TIES (SUTURE) ×3 IMPLANT
SYR CONTROL 10ML LL (SYRINGE) ×3 IMPLANT
SYRINGE TOOMEY DISP (SYRINGE) ×3 IMPLANT
TOWEL GREEN STERILE (TOWEL DISPOSABLE) ×3 IMPLANT
TOWEL GREEN STERILE FF (TOWEL DISPOSABLE) ×3 IMPLANT
TRAY FOL W/BAG SLVR 16FR STRL (SET/KITS/TRAYS/PACK) ×1 IMPLANT
TRAY FOLEY W/BAG SLVR 16FR LF (SET/KITS/TRAYS/PACK) ×2

## 2020-08-03 NOTE — Discharge Instructions (Signed)
[facility] _______[facility] ° °UMBILICAL OR INGUINAL HERNIA REPAIR: POST OP INSTRUCTIONS ° °Always review your discharge instruction sheet given to you by the facility where your surgery was performed. °IF YOU HAVE DISABILITY OR FAMILY LEAVE FORMS, YOU MUST BRING THEM TO THE OFFICE FOR PROCESSING.   °DO NOT GIVE THEM TO YOUR DOCTOR. ° °1. A  prescription for pain medication may be given to you upon discharge.  Take your pain medication as prescribed, if needed.  If narcotic pain medicine is not needed, then you may take acetaminophen (Tylenol) or ibuprofen (Advil) as needed. °2. Take your usually prescribed medications unless otherwise directed. °If you need a refill on your pain medication, please contact your pharmacy.  They will contact our office to request authorization. Prescriptions will not be filled after 5 pm or on week-ends. °3. You should follow a light diet the first 24 hours after arrival home, such as soup and crackers, etc.  Be sure to include lots of fluids daily.  Resume your normal diet the day after surgery. °4.Most patients will experience some swelling and bruising around the umbilicus or in the groin and scrotum.  Ice packs and reclining will help.  Swelling and bruising can take several days to resolve.  °6. It is common to experience some constipation if taking pain medication after surgery.  Increasing fluid intake and taking a stool softener (such as Colace) will usually help or prevent this problem from occurring.  A mild laxative (Milk of Magnesia or Miralax) should be taken according to package directions if there are no bowel movements after 48 hours. °7. Unless discharge instructions indicate otherwise, you may remove your bandages 24-48 hours after surgery, and you may shower at that time.  You may have steri-strips (small skin tapes) in place directly over the incision.  These strips should be left on the skin for 7-10 days.  If your surgeon used skin glue on the  incision, you may shower in 24 hours.  The glue will flake off over the next 2-3 weeks.  Any sutures or staples will be removed at the office during your follow-up visit. °8. ACTIVITIES:  You may resume regular (light) daily activities beginning the next day--such as daily self-care, walking, climbing stairs--gradually increasing activities as tolerated.  You may have sexual intercourse when it is comfortable.  Refrain from any heavy lifting or straining until approved by your doctor. ° °a.You may drive when you are no longer taking prescription pain medication, you can comfortably wear a seatbelt, and you can safely maneuver your car and apply brakes. °b.RETURN TO WORK:   °_____________________________________________ ° °9.You should see your doctor in the office for a follow-up appointment approximately 2-3 weeks after your surgery.  Make sure that you call for this appointment within a day or two after you arrive home to insure a convenient appointment time. °10.OTHER INSTRUCTIONS: _________________________ °   _____________________________________ ° °WHEN TO CALL YOUR DOCTOR: °1. Fever over 101.0 °2. Inability to urinate °3. Nausea and/or vomiting °4. Extreme swelling or bruising °5. Continued bleeding from incision. °6. Increased pain, redness, or drainage from the incision ° °The clinic staff is available to answer your questions during regular business hours.  Please don’t hesitate to call and ask to speak to one of the nurses for clinical concerns.  If you have a medical emergency, go to the nearest emergency room or call 911.  A surgeon from [facility] is always on call at the hospital ° ° °  [address], [address], [place], [place]  [address] ? ° [address] [address], [place],    [address] °[phone] ? [phone] ? FAX [phone] °Web site: [email or web address] °

## 2020-08-03 NOTE — Transfer of Care (Signed)
Immediate Anesthesia Transfer of Care Note  Patient: Ryan Holt  Procedure(s) Performed: LEFT INGUINAL HERNIA REPAIR WITH MESH (Left Inguinal) INSERTION OF MESH (Left Inguinal)  Patient Location: PACU  Anesthesia Type:General  Level of Consciousness: awake, alert  and oriented  Airway & Oxygen Therapy: Patient Spontanous Breathing  Post-op Assessment: Report given to RN, Post -op Vital signs reviewed and stable and Patient moving all extremities  Post vital signs: Reviewed and stable  Last Vitals:  Vitals Value Taken Time  BP 125/83 08/03/20 0930  Temp    Pulse 89 08/03/20 0930  Resp 17 08/03/20 0930  SpO2 100 % 08/03/20 0930  Vitals shown include unvalidated device data.  Last Pain:  Vitals:   08/03/20 0815  TempSrc:   PainSc: 0-No pain         Complications: No complications documented.

## 2020-08-03 NOTE — Anesthesia Postprocedure Evaluation (Signed)
Anesthesia Post Note  Patient: Isai Gottlieb  Procedure(s) Performed: LEFT INGUINAL HERNIA REPAIR WITH MESH (Left Inguinal) INSERTION OF MESH (Left Inguinal)     Patient location during evaluation: PACU Anesthesia Type: Regional and General Level of consciousness: awake and alert, awake and oriented Pain management: pain level controlled Vital Signs Assessment: post-procedure vital signs reviewed and stable Respiratory status: spontaneous breathing, nonlabored ventilation, respiratory function stable and patient connected to nasal cannula oxygen Cardiovascular status: blood pressure returned to baseline and stable Postop Assessment: no apparent nausea or vomiting Anesthetic complications: no   No complications documented.  Last Vitals:  Vitals:   08/03/20 1014 08/03/20 1015  BP: 121/87 121/87  Pulse: 86 80  Resp: 16 (!) 23  Temp: 36.6 C   SpO2: 98% 99%    Last Pain:  Vitals:   08/03/20 1014  TempSrc:   PainSc: 7                  Cecile Hearing

## 2020-08-03 NOTE — Op Note (Signed)
08/03/2020  9:11 AM  PATIENT:  Ryan Holt  39 y.o. male  PRE-OPERATIVE DIAGNOSIS:  LEFT INGUINAL HERNIA  POST-OPERATIVE DIAGNOSIS:  LEFT INGUINAL HERNIA  PROCEDURE:  Procedure(s): OPEN LEFT INGUINAL HERNIA REPAIR WITH MESH (Left) INSERTION OF MESH (Left)  SURGEON:  Surgeon(s) and Role:    Axel Filler, MD - Primary  ASSISTANTS: Myrtie Soman, RNFA   ANESTHESIA:   none  EBL:  minimal   BLOOD ADMINISTERED:none  DRAINS: none   LOCAL MEDICATIONS USED:  NONE  SPECIMEN:  No Specimen  DISPOSITION OF SPECIMEN:  N/A  COUNTS:  YES  TOURNIQUET:  * No tourniquets in log *  DICTATION: .Dragon Dictation Details of the procedure: The patient was taken back to the operating room. The patient was placed in supine position with bilateral SCDs in place. The patient was prepped and draped in the usual sterile fashion.  After appropriate anitbiotics were confirmed, a time-out was confirmed and all facts were verified.    A 5 cm incision was made just 1 cm superior to the inguinal ligament. Bovie cautery was used to maintain hemostasis dissection is carried down to the external oblique.  A standard incision was made laterally, and the external oblique was bluntly dissected away from the surrounding tissue with Metzenbaum scissors. The external oblique was elevated in the spermatic cord was bluntly dissected away from the surrounding tissue.  The ilioinguinal nerve was not identified.  The spermatic cord and the hernia were then bluntly dissected away from the pubic tubercle and a Penrose was placed around the hernia sac in the spermatic cord. The vas deferens was identified and protected at all portions of the case. Dissection of the cremasterics took place with Bovie cautery. Once the hernia sac was dissected away from the surrrounding cremesteric tissue , the hernia sac was entered laterally. There was were no sliding components and no femoral hernia palpated. The hernia sac was  highly ligated using 0 SIlk.   This retracted into the abdomen in the usual fashion.  At this time a Left-sided Progrip mesh was then anchored to the pubic tubercle with a 2-0 Prolene.  It was anchored to the shelving edge of the external oblique x 1 and the conjoint tendon cephalad x 1.  The wrap around of the mesh was sutured to the conjoint tendon as well.  The new internal ring did not strangulate the spermatic cord.   The tail was then tucked under the external oblique. At this time the area was irrigated out with sterile saline.    The external oblique was reapproximated using a 2-0 Vicryl in a running fashion. Scarpa's fascia was then reapproximated using a 3-0 Vicryl running fashion. The skin was then reapproximated with 4 Monocryl in a subcuticular fashion. The skin was then dressed with Dermabond.  The patient was taken to the recovery room in stable condition.      PLAN OF CARE: Discharge to home after PACU  PATIENT DISPOSITION:  PACU - hemodynamically stable.   Delay start of Pharmacological VTE agent (>24hrs) due to surgical blood loss or risk of bleeding: not applicable

## 2020-08-03 NOTE — Anesthesia Preprocedure Evaluation (Addendum)
Anesthesia Evaluation  Patient identified by MRN, date of birth, ID band Patient awake    Reviewed: Allergy & Precautions, NPO status , Patient's Chart, lab work & pertinent test results  History of Anesthesia Complications Negative for: history of anesthetic complications  Airway Mallampati: II  TM Distance: >3 FB Neck ROM: Full    Dental  (+) Partial Upper   Pulmonary neg pulmonary ROS,    Pulmonary exam normal breath sounds clear to auscultation       Cardiovascular Exercise Tolerance: Good negative cardio ROS Normal cardiovascular exam Rhythm:Regular Rate:Normal     Neuro/Psych negative neurological ROS  negative psych ROS   GI/Hepatic negative GI ROS, Neg liver ROS,   Endo/Other  negative endocrine ROS  Renal/GU negative Renal ROS     Musculoskeletal negative musculoskeletal ROS (+)   Abdominal   Peds  Hematology negative hematology ROS (+)   Anesthesia Other Findings Day of surgery medications reviewed with the patient.  Reproductive/Obstetrics                             Anesthesia Physical Anesthesia Plan  ASA: II  Anesthesia Plan: General   Post-op Pain Management:  Regional for Post-op pain   Induction: Intravenous  PONV Risk Score and Plan: 3 and Midazolam, Dexamethasone and Ondansetron  Airway Management Planned: Oral ETT  Additional Equipment:   Intra-op Plan:   Post-operative Plan: Extubation in OR  Informed Consent: I have reviewed the patients History and Physical, chart, labs and discussed the procedure including the risks, benefits and alternatives for the proposed anesthesia with the patient or authorized representative who has indicated his/her understanding and acceptance.     Interpreter used for [employer] Discussed with: CRNA  Anesthesia Plan Comments:        Anesthesia Quick Evaluation

## 2020-08-03 NOTE — Anesthesia Procedure Notes (Signed)
Anesthesia Regional Block: TAP block   Pre-Anesthetic Checklist: ,, timeout performed, Correct Patient, Correct Site, Correct Laterality, Correct Procedure, Correct Position, site marked, Risks and benefits discussed,  Surgical consent,  Pre-op evaluation,  At surgeon's request and post-op pain management  Laterality: Left  Prep: chloraprep       Needles:  Injection technique: Single-shot  Needle Type: Echogenic Needle     Needle Length: 9cm  Needle Gauge: 21     Additional Needles:   Procedures:,,,, ultrasound used (permanent image in chart),,,,  Narrative:  Start time: 08/03/2020 8:00 AM End time: 08/03/2020 8:10 AM Injection made incrementally with aspirations every 5 mL.  Performed by: Personally  Anesthesiologist: Cecile Hearing, MD  Additional Notes: No pain on injection. No increased resistance to injection. Injection made in 5cc increments.  Good needle visualization.  Patient tolerated procedure well.

## 2020-08-03 NOTE — Anesthesia Procedure Notes (Addendum)
Procedure Name: Intubation Date/Time: 08/03/2020 7:30 AM Performed by: Kyung Rudd, CRNA Pre-anesthesia Checklist: Patient identified, Emergency Drugs available, Suction available and Patient being monitored Patient Re-evaluated:Patient Re-evaluated prior to induction Oxygen Delivery Method: Circle System Utilized Preoxygenation: Pre-oxygenation with 100% oxygen Induction Type: IV induction Ventilation: Mask ventilation without difficulty Laryngoscope Size: Mac and 3 Grade View: Grade I Tube type: Oral Tube size: 7.5 mm Number of attempts: 1 Airway Equipment and Method: Stylet and Oral airway Placement Confirmation: ETT inserted through vocal cords under direct vision,  positive ETCO2 and breath sounds checked- equal and bilateral Secured at: 22 cm Tube secured with: Tape Dental Injury: Teeth and Oropharynx as per pre-operative assessment  Comments: AOI by Brayton El, SRNA with Dr. Gifford Shave supervising.

## 2020-08-03 NOTE — H&P (Signed)
History of Present Illness  The patient is a 39 year old male who presents with an inguinal hernia. Patient is a 39 year old male, Spanish-speaking comes in with a large left inguinal hernia. Patient states is been there for approximately 2 years. He states become more bothersome over the last couple months. 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  Patient states is able to massage the hernia and reduce it is lying down. He states it thereafter usually pops back out.  He's had only a previous open right inguinal hernia repair in the past as a child.   Diagnostic Studies History Colonoscopy  never  Allergies No Known Drug Allergies  [05/31/2020]: Allergies Reconciled   Medication History HYDROcodone-Acetaminophen (5-325MG  Tablet, Oral) Active. Medications Reconciled  Social History  Caffeine use  Coffee. No drug use   Family History  Arthritis  Father. Diabetes Mellitus  Mother. Hypertension  Mother.    Review of Systems Skin Not Present- Change in Wart/Mole, Dryness, Hives, Jaundice, New Lesions, Non-Healing Wounds, Rash and Ulcer. HEENT Not Present- Earache, Hearing Loss, Hoarseness, Nose Bleed, Oral Ulcers, Ringing in the Ears, Seasonal Allergies, Sinus Pain, Sore Throat, Visual Disturbances, Wears glasses/contact lenses and Yellow Eyes. Respiratory Not Present- Bloody sputum, Chronic Cough, Difficulty Breathing, Snoring and Wheezing. Breast Not Present- Breast Mass, Breast Pain, Nipple Discharge and Skin Changes. Gastrointestinal Not Present- Abdominal Pain, Bloating, Bloody Stool, Change in Bowel Habits, Chronic diarrhea, Constipation, Difficulty Swallowing, Excessive gas, Gets full quickly at meals, Hemorrhoids, Indigestion, Nausea, Rectal Pain and Vomiting. Male Genitourinary Not Present- Blood in Urine, Change in Urinary Stream, Frequency, Impotence, Nocturia, Painful Urination, Urgency and Urine  Leakage. Musculoskeletal Not Present- Back Pain, Joint Pain, Joint Stiffness, Muscle Pain, Muscle Weakness and Swelling of Extremities. Neurological Not Present- Decreased Memory, Fainting, Headaches, Numbness, Seizures, Tingling, Tremor, Trouble walking and Weakness. Psychiatric Not Present- Anxiety, Bipolar, Change in Sleep Pattern, Depression, Fearful and Frequent crying. All other systems negative  BP 123/90   Pulse 75   Temp 97.9 F (36.6 C) (Oral)   Resp 17   Ht 5\' 6"  (1.676 m)   Wt 83.9 kg   SpO2 98%   BMI 29.86 kg/m     Physical Exam  The physical exam findings are as follows: Note: Constitutional: No acute distress, conversant, appears stated age  Eyes: Anicteric sclerae, moist conjunctiva, no lid lag  Neck: No thyromegaly, trachea midline, no cervical lymphadenopathy  Lungs: Clear to auscultation biilaterally, normal respiratory effot  Cardiovascular: regular rate & rhythm, no murmurs, no peripheal edema, pedal pulses 2+  GI: Soft, no masses or hepatosplenomegaly, non-tender to palpation  MSK: Normal gait, no clubbing cyanosis, edema  Skin: No rashes, palpation reveals normal skin turgor  Psychiatric: Appropriate judgment and insight, oriented to person, place, and time  Abdomen Inspection Hernias - Inguinal hernia - Left - Incarcerated - Left.    Assessment & Plan LEFT INGUINAL HERNIA (K40.90) Impression: 39 year old male with large left inguinal hernia  1. The patient will like to proceed to the operating room for open right inguinal hernia repair with mesh.  2. I discussed with the patient the signs and symptoms of incarceration and strangulation and the need to proceed to the ER should they occur.  3. I discussed with the patient the risks and benefits of the procedure to include but not limited to: Infection, bleeding, damage to surrounding structures, possible need for further surgery, possible nerve pain, and possible  recurrence. The patient was understanding and  wishes to proceed.

## 2020-08-04 ENCOUNTER — Encounter (HOSPITAL_COMMUNITY): Payer: Self-pay | Admitting: General Surgery

## 2021-04-20 IMAGING — CT CT ABD-PELV W/ CM
2 of 4 series · 16 of 46 positions shown, 18 images · IV contrast (APPLIED)
Comparison: None.

CLINICAL DATA: Abdominal pain radiating to left groin. Suspected
hernia.

EXAM:
CT ABDOMEN AND PELVIS WITH CONTRAST
TECHNIQUE: Multidetector CT imaging of the abdomen and pelvis was performed
using the standard protocol following bolus administration of
intravenous contrast.
CONTRAST:  100mL OMNIPAQUE IOHEXOL 300 MG/ML  SOLN

[Series 3: abd/ pelvis 5.0 i30f 2 · axial · 0.93mm/px · z∈[+730,+1240]mm · 13 of 112 slices shown, 15 images]
[im 5/112  soft-tissue]
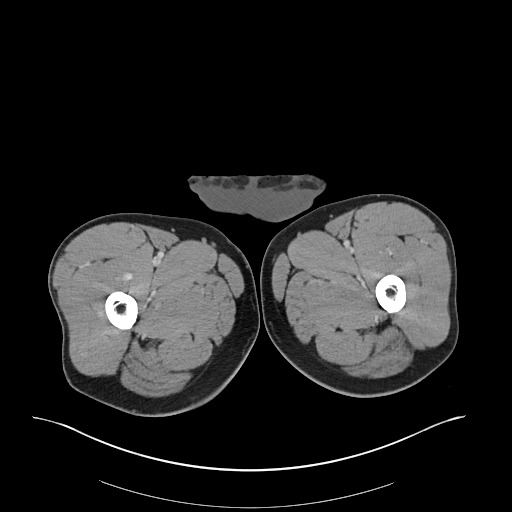
[im 5/112  bone]
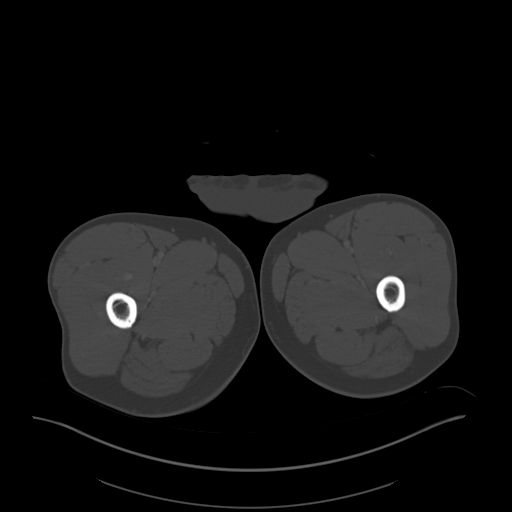
[im 14/112  soft-tissue]
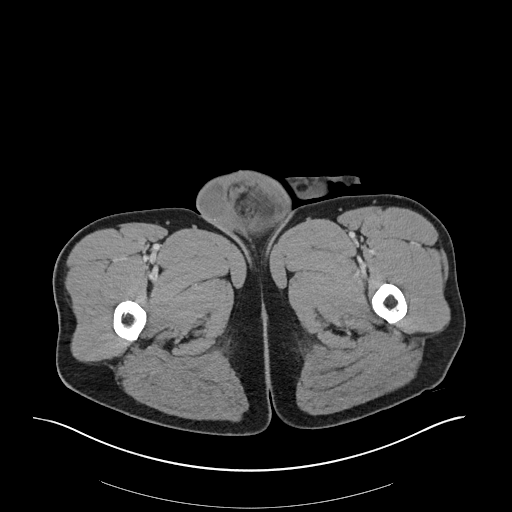
[im 23/112  soft-tissue]
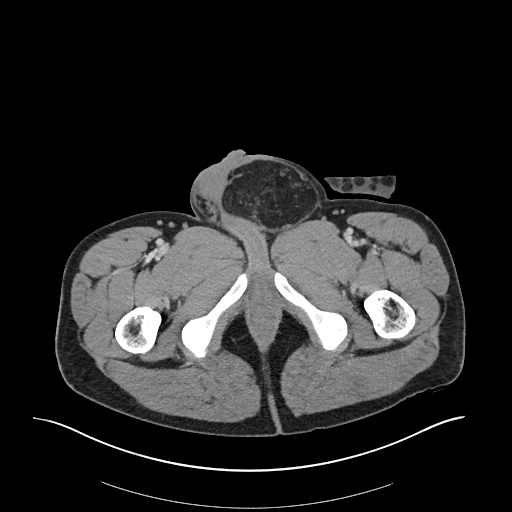
[im 32/112  soft-tissue]
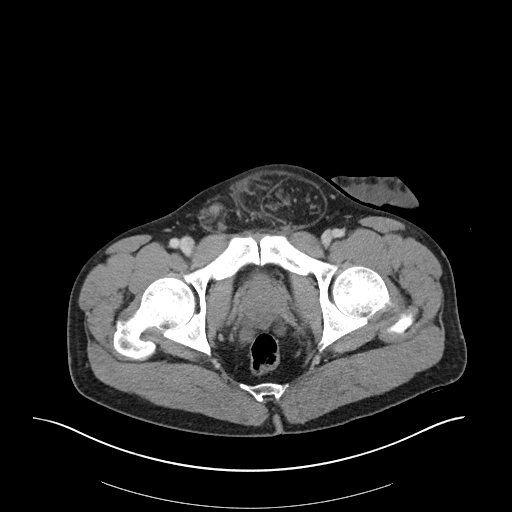
[im 40/112  soft-tissue]
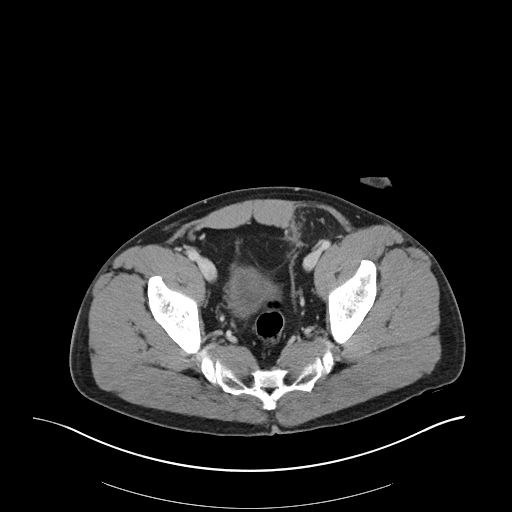
[im 49/112  soft-tissue]
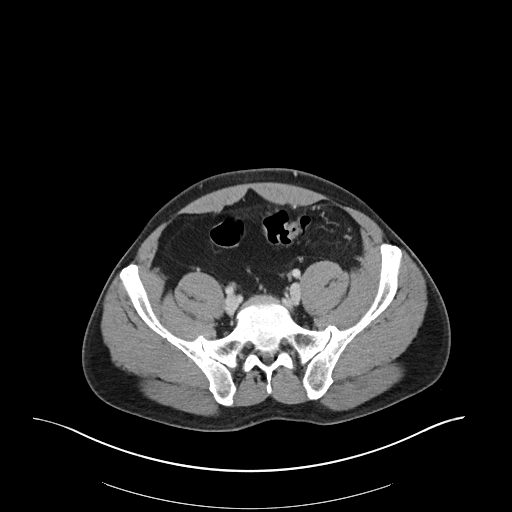
[im 58/112  soft-tissue]
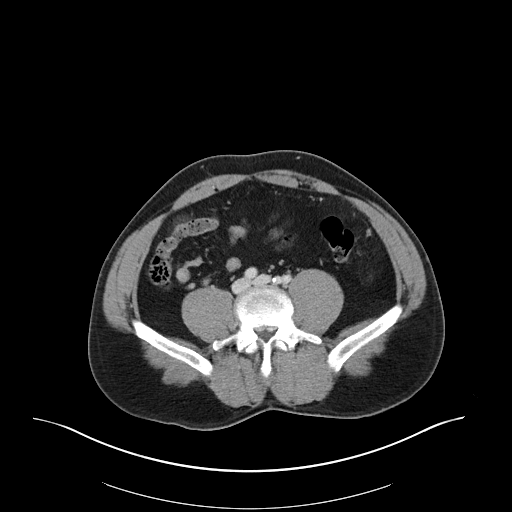
[im 63/112  soft-tissue]
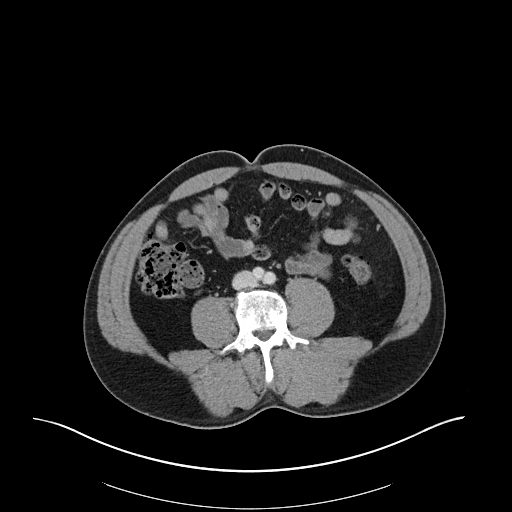
[im 72/112  soft-tissue]
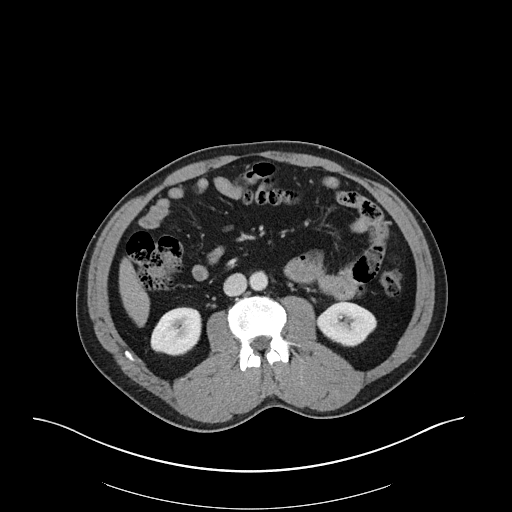
[im 72/112  bone]
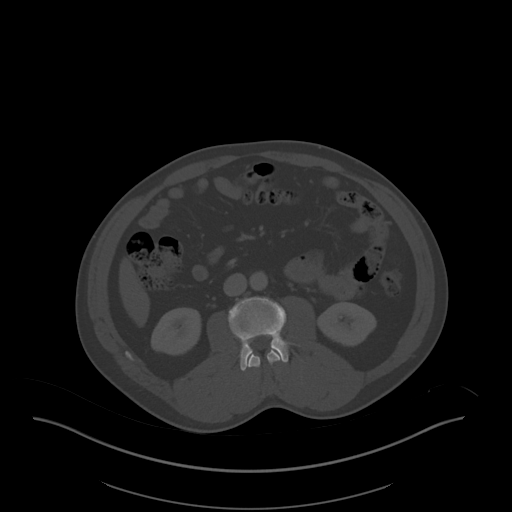
[im 80/112  soft-tissue]
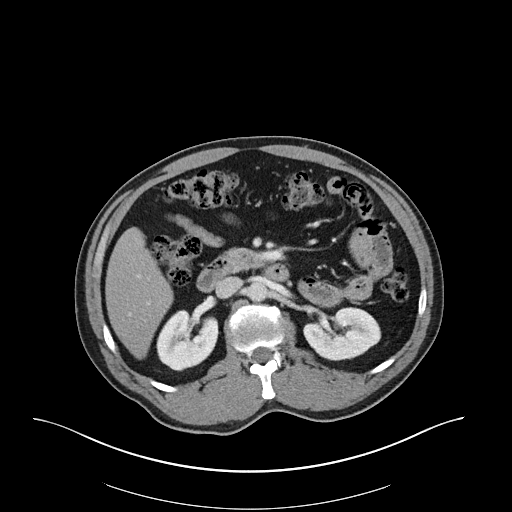
[im 89/112  soft-tissue]
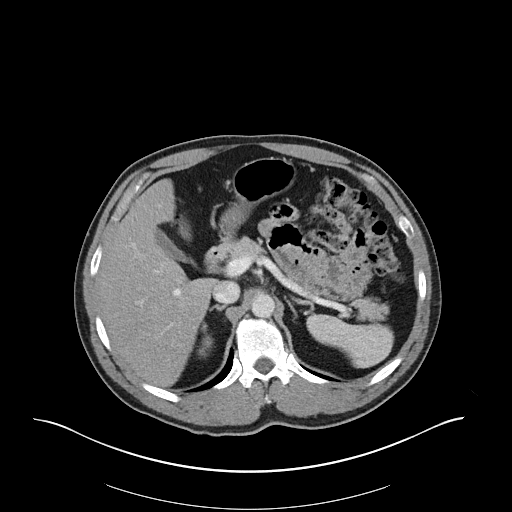
[im 98/112  soft-tissue]
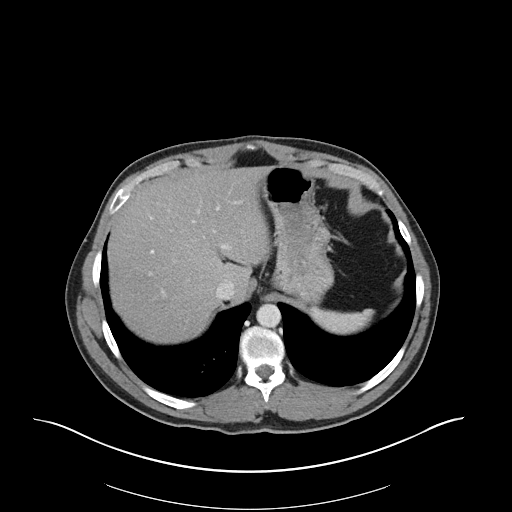
[im 107/112  soft-tissue]
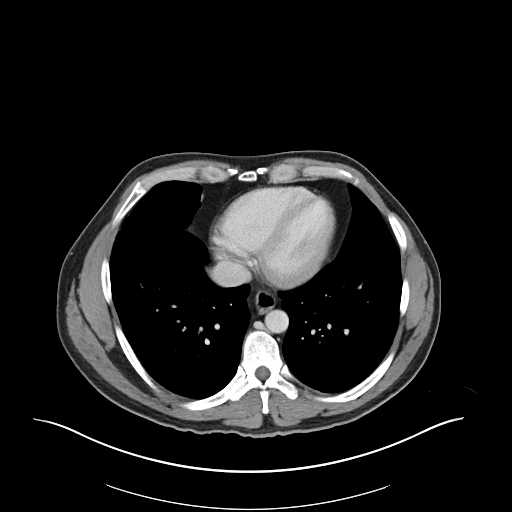

[Series 6: coronal soft tissue · coronal · 0.72mm/px · 3 of 101 slices shown]
[im 34/101  soft-tissue]
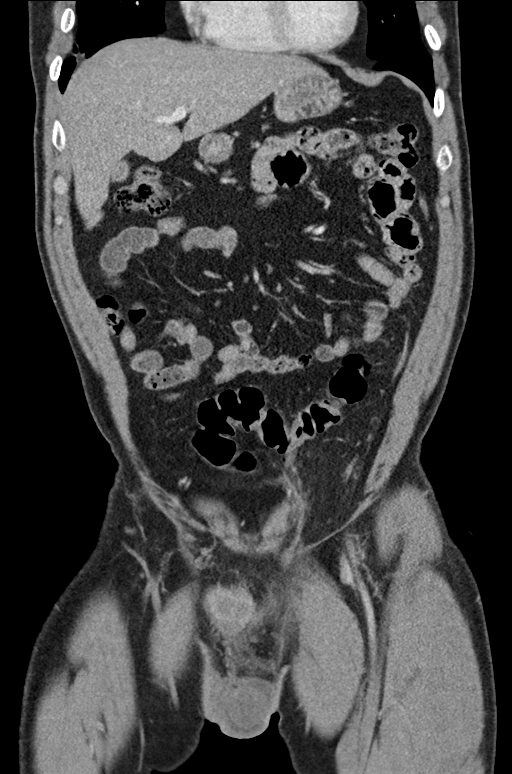
[im 45/101  soft-tissue]
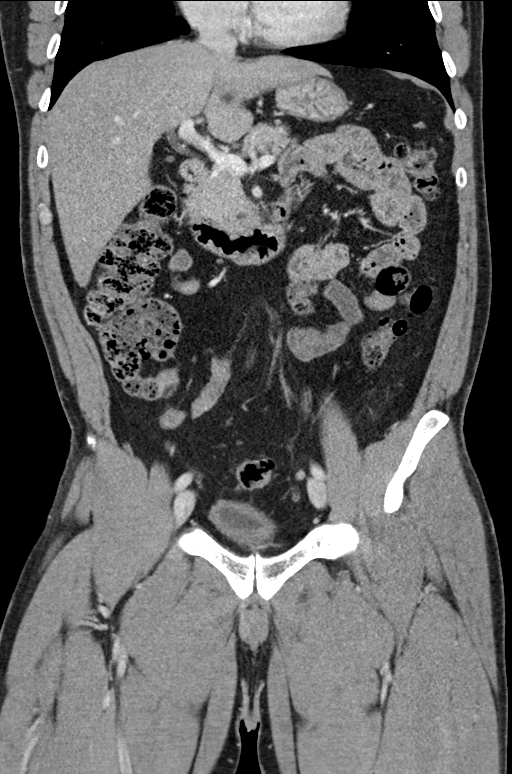
[im 56/101  soft-tissue]
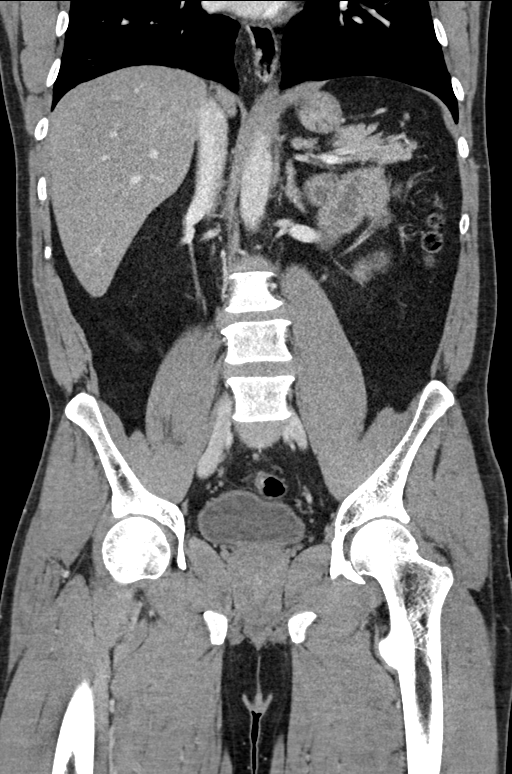

[16 of 46 positions shown; findings below may reference images not displayed]

FINDINGS: Lower Chest: No acute findings.

Hepatobiliary: No hepatic masses identified. Gallbladder is
unremarkable. No evidence of biliary ductal dilatation.

Pancreas:  No mass or inflammatory changes.

Spleen: Within normal limits in size and appearance.

Adrenals/Urinary Tract: No masses identified. No evidence of
ureteral calculi or hydronephrosis.

Stomach/Bowel: No evidence of obstruction, inflammatory process or
abnormal fluid collections. Normal appendix visualized.

Vascular/Lymphatic: No pathologically enlarged lymph nodes. No
abdominal aortic aneurysm.

Reproductive:  No mass or other significant abnormality.

Other: A large left inguinal hernia is seen which contains only fat.

Musculoskeletal:  No suspicious bone lesions identified.
IMPRESSION: Large left inguinal hernia containing only fat.

## 2024-07-25 ENCOUNTER — Encounter (HOSPITAL_COMMUNITY): Payer: Self-pay | Admitting: *Deleted

## 2024-07-25 ENCOUNTER — Emergency Department (HOSPITAL_COMMUNITY): Payer: Self-pay

## 2024-07-25 ENCOUNTER — Other Ambulatory Visit: Payer: Self-pay

## 2024-07-25 ENCOUNTER — Emergency Department (HOSPITAL_COMMUNITY)
Admission: EM | Admit: 2024-07-25 | Discharge: 2024-07-25 | Disposition: A | Discharge status: Home / Self Care | Payer: Self-pay | Attending: Emergency Medicine | Admitting: Emergency Medicine

## 2024-07-25 DIAGNOSIS — R1031 Right lower quadrant pain: Secondary | ICD-10-CM | POA: Insufficient documentation

## 2024-07-25 LAB — COMPREHENSIVE METABOLIC PANEL WITH GFR
ALT: 28 U/L (ref 0–44)
AST: 25 U/L (ref 15–41)
Albumin: 3.7 g/dL (ref 3.5–5.0)
Alkaline Phosphatase: 83 U/L (ref 38–126)
Anion gap: 11 (ref 5–15)
BUN: 13 mg/dL (ref 6–20)
CO2: 25 mmol/L (ref 22–32)
Calcium: 9.2 mg/dL (ref 8.9–10.3)
Chloride: 105 mmol/L (ref 98–111)
Creatinine, Ser: 1.04 mg/dL (ref 0.61–1.24)
GFR, Estimated: >60 mL/min (ref >60)
Glucose, Bld: 90 mg/dL (ref 70–99)
Potassium: 4.3 mmol/L (ref 3.5–5.1)
Sodium: 141 mmol/L (ref 135–145)
Total Bilirubin: 1 mg/dL (ref 0.0–1.2)
Total Protein: 7.3 g/dL (ref 6.5–8.1)

## 2024-07-25 LAB — URINALYSIS, ROUTINE W REFLEX MICROSCOPIC
Bacteria, UA: NONE SEEN
Bilirubin Urine: NEGATIVE
Glucose, UA: NEGATIVE mg/dL
Ketones, ur: NEGATIVE mg/dL
Leukocytes,Ua: NEGATIVE
Nitrite: NEGATIVE
Protein, ur: NEGATIVE mg/dL
Specific Gravity, Urine: 1.015 (ref 1.005–1.030)
pH: 5 (ref 5.0–8.0)

## 2024-07-25 LAB — CBC
HCT: 47 % (ref 39.0–52.0)
Hemoglobin: 16.2 g/dL (ref 13.0–17.0)
MCH: 32.5 pg (ref 26.0–34.0)
MCHC: 34.5 g/dL (ref 30.0–36.0)
MCV: 94.2 fL (ref 80.0–100.0)
Platelets: 239 K/uL (ref 150–400)
RBC: 4.99 MIL/uL (ref 4.22–5.81)
RDW: 12.1 % (ref 11.5–15.5)
WBC: 8.9 K/uL (ref 4.0–10.5)
nRBC: 0 % (ref 0.0–0.2)

## 2024-07-25 LAB — LIPASE, BLOOD: Lipase: 31 U/L (ref 11–51)

## 2024-07-25 MED ORDER — IOHEXOL 350 MG/ML SOLN
75.0000 mL | Freq: Once | INTRAVENOUS | Status: AC | PRN
  Administered 2024-07-25: 75 mL via INTRAVENOUS

## 2024-07-25 MED ORDER — POLYETHYLENE GLYCOL 3350 17 G PO PACK: 17.0000 g | PACK | Freq: Every day | ORAL | 0 refills | Status: AC | PRN

## 2024-07-25 MED ORDER — SODIUM CHLORIDE 0.9 % IV BOLUS
1000.0000 mL | Freq: Once | INTRAVENOUS | Status: AC
  Administered 2024-07-25: 1000 mL via INTRAVENOUS

## 2024-07-25 MED ORDER — ONDANSETRON HCL 4 MG/2ML IJ SOLN
4.0000 mg | Freq: Once | INTRAMUSCULAR | Status: AC
  Administered 2024-07-25: 4 mg via INTRAVENOUS
  Filled 2024-07-25: qty 2

## 2024-07-25 MED ORDER — MORPHINE SULFATE (PF) 4 MG/ML IV SOLN
4.0000 mg | Freq: Once | INTRAVENOUS | Status: AC
  Administered 2024-07-25: 4 mg via INTRAVENOUS
  Filled 2024-07-25: qty 1

## 2024-07-25 NOTE — ED Triage Notes (Signed)
 Pt having RLQ pain for past 3 days. Says today he started feeling nauseous and has headache. Denies any vomiting, diarrhea, constipation.

## 2024-07-25 NOTE — ED Provider Notes (Signed)
 [place] EMERGENCY DEPARTMENT AT [facility] Provider Note   CSN: [id number] Arrival date & time: 07/25/24  1327     Patient presents with: Abdominal Pain   Ryan Holt is a 43 y.o. male.   Pt is a 43 yo male with no significant pmhx.  He has been having RLQ pain for the past 3 days.  He denies n/v/d or fever.  He was able to eat today without any problem.  He has had bilateral hernia repair in the past, but this does not feel like that.  Due to language barrier, an interpreter was present during the history-taking and subsequent discussion (and for part of the physical exam) with this patient.   He was offered the video interpreter, but requested his wife to interpret.       Prior to Admission medications   Medication Sig Start Date End Date Taking? Authorizing Provider  polyethylene glycol (MIRALAX ) 17 g packet Take 17 g by mouth daily as needed for mild constipation. 07/25/24  Yes Dean Clarity, MD  acetaminophen  (TYLENOL ) 500 MG tablet Take 1,000 mg by mouth every 6 (six) hours as needed for mild pain, moderate pain or headache.    [provider]    Allergies: Patient has no known allergies.    Review of Systems  Gastrointestinal:  Positive for abdominal pain.  All other systems reviewed and are negative.   Updated Vital Signs BP 123/83   Pulse 81   Temp 98.4 F (36.9 C) (Oral)   Resp 17   Ht 5' 9 (1.753 m)   Wt 81.6 kg   SpO2 100%   BMI 26.58 kg/m   Physical Exam Vitals and nursing note reviewed.  Constitutional:      Appearance: He is well-developed.  HENT:     Head: Normocephalic and atraumatic.     Mouth/Throat:     Mouth: Mucous membranes are moist.     Pharynx: Oropharynx is clear.  Eyes:     Extraocular Movements: Extraocular movements intact.     Pupils: Pupils are equal, round, and reactive to light.  Cardiovascular:     Rate and Rhythm: Normal rate and regular rhythm.     Heart sounds: Normal heart sounds.   Pulmonary:     Effort: Pulmonary effort is normal.     Breath sounds: Normal breath sounds.  Abdominal:     General: Abdomen is flat. Bowel sounds are normal.     Palpations: Abdomen is soft.     Tenderness: There is abdominal tenderness in the right lower quadrant.  Skin:    General: Skin is warm.     Capillary Refill: Capillary refill takes less than 2 seconds.  Neurological:     General: No focal deficit present.     Mental Status: He is alert and oriented to person, place, and time.  Psychiatric:        Mood and Affect: Mood normal.        Behavior: Behavior normal.     (all labs ordered are listed, but only abnormal results are displayed) Labs Reviewed  URINALYSIS, ROUTINE W REFLEX MICROSCOPIC - Abnormal; Notable for the following components:      Result Value   Hgb urine dipstick SMALL (*)    All other components within normal limits  LIPASE, BLOOD  COMPREHENSIVE METABOLIC PANEL WITH GFR  CBC    EKG: None  Radiology: CT ABDOMEN PELVIS W CONTRAST Result Date: 07/25/2024 CLINICAL DATA:  Right lower quadrant abdominal pain.  EXAM: CT ABDOMEN AND PELVIS WITH CONTRAST TECHNIQUE: Multidetector CT imaging of the abdomen and pelvis was performed using the standard protocol following bolus administration of intravenous contrast. RADIATION DOSE REDUCTION: This exam was performed according to the departmental dose-optimization program which includes automated exposure control, adjustment of the mA and/or kV according to patient size and/or use of iterative reconstruction technique. CONTRAST:  75mL OMNIPAQUE  IOHEXOL  350 MG/ML SOLN COMPARISON:  05/27/2020. FINDINGS: Lower chest: No acute abnormality. Hepatobiliary: No focal liver abnormality is seen. Fatty infiltration of the liver is noted. No gallstones, gallbladder wall thickening, or biliary dilatation. Pancreas: Unremarkable. No pancreatic ductal dilatation or surrounding inflammatory changes. Spleen: Normal in size without focal  abnormality. Adrenals/Urinary Tract: The adrenal glands are within normal limits. The kidneys enhance symmetrically. No renal calculus or hydronephrosis bilaterally. The bladder is unremarkable. Stomach/Bowel: The stomach is within normal limits. No bowel obstruction, free air, or pneumatosis is seen. A few scattered diverticula are present along the colon without evidence of diverticulitis. The appendix is slightly prominent in size measuring 7 mm, however air is identified at the tip of the appendix. No focal bowel wall thickening is seen. Fat stranding is noted anterior to the distal ileum in the right lower quadrant. Vascular/Lymphatic: No significant vascular findings are present. No enlarged abdominal or pelvic lymph nodes. Reproductive: Prostate is unremarkable. Other: No abdominal or pelvic ascites. Musculoskeletal: No acute osseous abnormality. IMPRESSION: 1. Slightly prominent appendix measuring 7 mm, however air is identified at the tip, decreasing likelihood for appendicitis. Clinical correlation is suggested. 2. Nonspecific fat stranding in the right lower quadrant anterior to the distal ileum, may be infectious or inflammatory. 3. Hepatic steatosis. Electronically Signed   By: Leita Birmingham M.D.   On: 07/25/2024 16:35     Procedures   Medications Ordered in the ED  iohexol  (OMNIPAQUE ) 350 MG/ML injection 75 mL (75 mLs Intravenous Contrast Given 07/25/24 1612)  sodium chloride  0.9 % bolus 1,000 mL (0 mLs Intravenous Stopped 07/25/24 1742)  morphine  (PF) 4 MG/ML injection 4 mg (4 mg Intravenous Given 07/25/24 1627)  ondansetron  (ZOFRAN ) injection 4 mg (4 mg Intravenous Given 07/25/24 1627)                                    Medical Decision Making Amount and/or Complexity of Data Reviewed Labs: ordered.  Risk OTC drugs. Prescription drug management.   This patient presents to the ED for concern of rlq abd pain, this involves an extensive number of treatment options, and is a  complaint that carries with it a high risk of complications and morbidity.  The differential diagnosis includes kidney stone, appy, hernia, msk, constipation   Co morbidities that complicate the patient evaluation  none   Additional history obtained:  Additional history obtained from epic chart review External records from outside source obtained and reviewed including wife   Lab Tests:  I Ordered, and personally interpreted labs.  The pertinent results include:  cbc nl, cmp nl, lip nl, ua nl   Imaging Studies ordered:  I ordered imaging studies including ct abd/pelvis  I independently visualized and interpreted imaging which showed  . Slightly prominent appendix measuring 7 mm, however air is  identified at the tip, decreasing likelihood for appendicitis.  Clinical correlation is suggested.  2. Nonspecific fat stranding in the right lower quadrant anterior to  the distal ileum, may be infectious or inflammatory.  3. Hepatic steatosis.  I agree with the radiologist interpretation   Medicines ordered and prescription drug management:  I ordered medication including ivfs/morphine /zofran   for sx  Reevaluation of the patient after these medicines showed that the patient improved I have reviewed the patients home medicines and have made adjustments as needed   Test Considered:  ct  Consultations Obtained:  I requested consultation with the surgeon (Dr. Stevie),  and discussed lab and imaging findings as well as pertinent plan - he looked at the CT scan and does not think pt has appendicitis   Problem List / ED Course:  RLQ pain:  no fever. No wbc.  CT reading not 100% nl appendix.  Surgery does not feel pt has appy, but pt is given instructions to return if worse.     Reevaluation:  After the interventions noted above, I reevaluated the patient and found that they have :improved   Social Determinants of Health:  Lives at home.  Spanish speaker.  Self pay.   No pcp.   Dispostion:  After consideration of the diagnostic results and the patients response to treatment, I feel that the patent would benefit from discharge with instructions to return if worse.       Final diagnoses:  Right lower quadrant abdominal pain    ED Discharge Orders          Ordered    polyethylene glycol (MIRALAX ) 17 g packet  Daily PRN        07/25/24 1737               Taetum Flewellen, MD 07/25/24 2034

## 2024-07-25 NOTE — ED Provider Triage Note (Signed)
 Emergency Medicine Provider Triage Evaluation Note  Haniel Fix , a 43 y.o. male  was evaluated in triage.  Pt complains of abdominal pain. Pain in RLQ, started on Friday, worse today.  No nausea, vomiting, or diarrhea. Hx renal hernia repair, no other history of abdominal surgeries.  Review of Systems  Positive:  Negative:   Physical Exam  BP 116/85   Pulse 92   Temp 98 F (36.7 C)   Resp 16   Ht 5' 9 (1.753 m)   Wt 81.6 kg   SpO2 99%   BMI 26.58 kg/m  Gen:   Awake, no distress   Resp:  Normal effort  MSK:   Moves extremities without difficulty  Other:  RLQ TTP  Medical Decision Making  Medically screening exam initiated at 2:05 PM.  Appropriate orders placed.  Seabron Iannello was informed that the remainder of the evaluation will be completed by another provider, this initial triage assessment does not replace that evaluation, and the importance of remaining in the ED until their evaluation is complete.     Nora Lauraine LABOR, PA-C 07/25/24 1406
